# Patient Record
Sex: Male | Born: 1950
Health system: Southern US, Community
[De-identification: ages and names within clinical notes are randomized; demographics above are authoritative.]

## PROBLEM LIST (undated history)

## (undated) DIAGNOSIS — K219 Gastro-esophageal reflux disease without esophagitis: Secondary | ICD-10-CM

## (undated) DIAGNOSIS — K2289 Other specified disease of esophagus: Secondary | ICD-10-CM

## (undated) DIAGNOSIS — C140 Malignant neoplasm of pharynx, unspecified: Secondary | ICD-10-CM

## (undated) DIAGNOSIS — I10 Essential (primary) hypertension: Secondary | ICD-10-CM

## (undated) DIAGNOSIS — Z931 Gastrostomy status: Secondary | ICD-10-CM

## (undated) HISTORY — DX: Gastro-esophageal reflux disease without esophagitis: K21.9

## (undated) HISTORY — DX: Malignant neoplasm of pharynx, unspecified: C14.0

## (undated) HISTORY — DX: Gastrostomy status: Z93.1

## (undated) HISTORY — DX: Essential (primary) hypertension: I10

## (undated) HISTORY — DX: Other specified disease of esophagus: K22.89

---

## 2021-05-10 ENCOUNTER — Inpatient Hospital Stay: Payer: Medicare Managed Care Other

## 2021-05-10 ENCOUNTER — Encounter: Payer: Self-pay | Admitting: Critical Care Medicine

## 2021-05-10 ENCOUNTER — Inpatient Hospital Stay
Admission: AD | Admit: 2021-05-10 | Discharge: 2021-05-14 | DRG: 199 | Disposition: A | Discharge status: Home / Self Care | Payer: Medicare Managed Care Other | Source: Other Acute Inpatient Hospital | Attending: Internal Medicine | Admitting: Internal Medicine

## 2021-05-10 DIAGNOSIS — Z79890 Hormone replacement therapy (postmenopausal): Secondary | ICD-10-CM

## 2021-05-10 DIAGNOSIS — J189 Pneumonia, unspecified organism: Secondary | ICD-10-CM | POA: Diagnosis present

## 2021-05-10 DIAGNOSIS — Z8521 Personal history of malignant neoplasm of larynx: Secondary | ICD-10-CM

## 2021-05-10 DIAGNOSIS — E785 Hyperlipidemia, unspecified: Secondary | ICD-10-CM | POA: Diagnosis present

## 2021-05-10 DIAGNOSIS — Z79899 Other long term (current) drug therapy: Secondary | ICD-10-CM

## 2021-05-10 DIAGNOSIS — Z923 Personal history of irradiation: Secondary | ICD-10-CM

## 2021-05-10 DIAGNOSIS — A419 Sepsis, unspecified organism: Secondary | ICD-10-CM

## 2021-05-10 DIAGNOSIS — I10 Essential (primary) hypertension: Secondary | ICD-10-CM | POA: Diagnosis present

## 2021-05-10 DIAGNOSIS — K219 Gastro-esophageal reflux disease without esophagitis: Secondary | ICD-10-CM | POA: Diagnosis present

## 2021-05-10 DIAGNOSIS — Z792 Long term (current) use of antibiotics: Secondary | ICD-10-CM

## 2021-05-10 DIAGNOSIS — K223 Perforation of esophagus: Secondary | ICD-10-CM | POA: Diagnosis present

## 2021-05-10 DIAGNOSIS — E039 Hypothyroidism, unspecified: Secondary | ICD-10-CM | POA: Diagnosis present

## 2021-05-10 DIAGNOSIS — Z85819 Personal history of malignant neoplasm of unspecified site of lip, oral cavity, and pharynx: Secondary | ICD-10-CM

## 2021-05-10 DIAGNOSIS — K9421 Gastrostomy hemorrhage: Secondary | ICD-10-CM | POA: Diagnosis present

## 2021-05-10 DIAGNOSIS — E162 Hypoglycemia, unspecified: Secondary | ICD-10-CM | POA: Diagnosis not present

## 2021-05-10 DIAGNOSIS — J982 Interstitial emphysema: Principal | ICD-10-CM | POA: Diagnosis present

## 2021-05-10 DIAGNOSIS — Z87891 Personal history of nicotine dependence: Secondary | ICD-10-CM

## 2021-05-10 LAB — URINALYSIS REFLEX TO MICROSCOPIC EXAM - REFLEX TO CULTURE
Bilirubin, UA: NEGATIVE
Blood, UA: NEGATIVE
Glucose, UA: NEGATIVE
Leukocyte Esterase, UA: NEGATIVE
Nitrite, UA: NEGATIVE
Specific Gravity UA: 1.036 — AB (ref 1.001–1.035)
Urine pH: 7.5 (ref 5.0–8.0)
Urobilinogen, UA: NORMAL mg/dL (ref 0.2–2.0)

## 2021-05-10 LAB — PT AND APTT
PT INR: 1.2 — ABNORMAL HIGH (ref 0.9–1.1)
PT: 13.8 s — ABNORMAL HIGH (ref 10.1–12.9)
PTT: 26 s — ABNORMAL LOW (ref 27–39)

## 2021-05-10 LAB — COMPREHENSIVE METABOLIC PANEL
ALT: 50 U/L (ref 0–55)
AST (SGOT): 35 U/L (ref 5–41)
Albumin/Globulin Ratio: 0.9 (ref 0.9–2.2)
Albumin: 3.6 g/dL (ref 3.5–5.0)
Alkaline Phosphatase: 72 U/L (ref 37–117)
Anion Gap: 10 (ref 5.0–15.0)
BUN: 29 mg/dL — ABNORMAL HIGH (ref 9.0–28.0)
Bilirubin, Total: 0.5 mg/dL (ref 0.2–1.2)
CO2: 26 mEq/L (ref 17–29)
Calcium: 9.4 mg/dL (ref 7.9–10.2)
Chloride: 103 mEq/L (ref 99–111)
Creatinine: 0.9 mg/dL (ref 0.5–1.5)
Globulin: 4.2 g/dL — ABNORMAL HIGH (ref 2.0–3.6)
Glucose: 92 mg/dL (ref 70–100)
Potassium: 4.2 mEq/L (ref 3.5–5.3)
Protein, Total: 7.8 g/dL (ref 6.0–8.3)
Sodium: 139 mEq/L (ref 135–145)

## 2021-05-10 LAB — CBC AND DIFFERENTIAL
Absolute NRBC: 0 10*3/uL (ref 0.00–0.00)
Basophils Absolute Automated: 0.04 10*3/uL (ref 0.00–0.08)
Basophils Automated: 0.4 %
Eosinophils Absolute Automated: 0.08 10*3/uL (ref 0.00–0.44)
Eosinophils Automated: 0.9 %
Hematocrit: 37.7 % (ref 37.6–49.6)
Hgb: 12.4 g/dL — ABNORMAL LOW (ref 12.5–17.1)
Immature Granulocytes Absolute: 0.02 10*3/uL (ref 0.00–0.07)
Immature Granulocytes: 0.2 %
Lymphocytes Absolute Automated: 1.71 10*3/uL (ref 0.42–3.22)
Lymphocytes Automated: 18.4 %
MCH: 23.5 pg — ABNORMAL LOW (ref 25.1–33.5)
MCHC: 32.9 g/dL (ref 31.5–35.8)
MCV: 71.4 fL — ABNORMAL LOW (ref 78.0–96.0)
MPV: 11.9 fL (ref 8.9–12.5)
Monocytes Absolute Automated: 0.76 10*3/uL (ref 0.21–0.85)
Monocytes: 8.2 %
Neutrophils Absolute: 6.66 10*3/uL — ABNORMAL HIGH (ref 1.10–6.33)
Neutrophils: 71.9 %
Nucleated RBC: 0 /100 WBC (ref 0.0–0.0)
Platelets: 183 10*3/uL (ref 142–346)
RBC: 5.28 10*6/uL (ref 4.20–5.90)
RDW: 15 % (ref 11–15)
WBC: 9.27 10*3/uL (ref 3.10–9.50)

## 2021-05-10 LAB — TYPE AND SCREEN
AB Screen Gel: NEGATIVE
ABO Rh: O POS

## 2021-05-10 LAB — GFR: EGFR: >60

## 2021-05-10 MED ORDER — SODIUM CHLORIDE 0.9 % IV MBP
4.5000 g | Freq: Three times a day (TID) | INTRAVENOUS | Status: DC
Start: 2021-05-10 — End: 2021-05-14
  Administered 2021-05-10 – 2021-05-14 (×12): 4.5 g via INTRAVENOUS
  Filled 2021-05-10 (×12): qty 20

## 2021-05-10 MED ORDER — PANTOPRAZOLE SODIUM 40 MG IV SOLR
40.0000 mg | Freq: Two times a day (BID) | INTRAVENOUS | Status: DC
Start: 2021-05-10 — End: 2021-05-14
  Administered 2021-05-10 – 2021-05-14 (×8): 40 mg via INTRAVENOUS
  Filled 2021-05-10 (×8): qty 40

## 2021-05-10 MED ORDER — SENNOSIDES-DOCUSATE SODIUM 8.6-50 MG PO TABS
1.0000 | ORAL_TABLET | Freq: Two times a day (BID) | ORAL | Status: DC
Start: 2021-05-10 — End: 2021-05-10

## 2021-05-10 MED ORDER — FLUCONAZOLE IN SODIUM CHLORIDE 400-0.9 MG/200ML-% IV SOLN
400.0000 mg | INTRAVENOUS | Status: DC
Start: 2021-05-10 — End: 2021-05-14
  Administered 2021-05-10 – 2021-05-13 (×4): 400 mg via INTRAVENOUS
  Filled 2021-05-10 (×6): qty 200

## 2021-05-10 MED ORDER — METRONIDAZOLE IN NACL 500 MG/100 ML IV SOLN (WRAP)
500.0000 mg | Freq: Three times a day (TID) | INTRAVENOUS | Status: DC
Start: 2021-05-10 — End: 2021-05-12
  Administered 2021-05-10 – 2021-05-12 (×6): 500 mg via INTRAVENOUS
  Filled 2021-05-10 (×6): qty 100

## 2021-05-10 MED ORDER — LEVOTHYROXINE SODIUM 100 MCG IV SOLR
37.5000 ug | Freq: Every day | INTRAVENOUS | Status: DC
Start: 2021-05-11 — End: 2021-05-12
  Administered 2021-05-11 – 2021-05-12 (×2): 37.5 ug via INTRAVENOUS
  Filled 2021-05-10 (×3): qty 100

## 2021-05-10 NOTE — Consults (Signed)
SURGICAL CONSULTATION  Team 2: Thoracic Surgery, [facility] [phone]    Date Time: 05/10/21 8:53 PM  Patient Name: Charles Peterson  Attending Physician: Velia Meyer, MD  Consulting Physician:  Catha Nottingham, MD   Reason for consultation: TXF from OSH with pneumomediastinum    History of Present Illness:   Charles Peterson is a 70 y.o. male w/ PMH of throat cancer s/p resection by ENT, dysphagia s/p esophageal dilations who p/w blood in his PEG.  The bloody output started on Friday ago. He has never had this before. He presented to a hospital in [place] and they have requested transfer to a hospital with thoracic surgery availability. Denies chest pain, SOB, fever, nausea or vomiting. He has difficulty swallowing and has failed multiple outpatient SLP evaluations. He has been getting esophageal dilations x2. Last was April 04, 2021.     His WBC was 9.3. Other labs notable for a Bun of 29. CT A/P demonstrates air posterior to the mid to lower esophagus without fluid or edema. RLL pneumonia seen. CXR demonstrates RLL haziness.     He had a tracheal resection with stoma creation with a PEG 8 years ago at [facility].     Past Medical History:     Past Medical History:   Diagnosis Date    Esophageal dilatation     Gastroesophageal reflux disease     Hypertension     PEG (percutaneous endoscopic gastrostomy) status     Throat cancer        Past Surgical History:     Past Surgical History:   Procedure Laterality Date    NECK SURGERY         Family History:   History reviewed. No pertinent family history.    Social History:     Social History     Socioeconomic History    Marital status: Unknown     Spouse name: Not on file    Number of children: Not on file    Years of education: Not on file    Highest education level: Not on file   Occupational History    Not on file   Tobacco Use    Smoking status: Former     Types: Cigarettes     Quit date: 05/05/2001     Years since quitting: 20.0    Smokeless tobacco: Never   Vaping  Use    Vaping Use: Never used   Substance and Sexual Activity    Alcohol use: Never    Drug use: Never    Sexual activity: Not Currently   Other Topics Concern    Not on file   Social History Narrative    Not on file     Social Determinants of Health     Financial Resource Strain: Not on file   Food Insecurity: Not on file   Transportation Needs: Not on file   Physical Activity: Not on file   Stress: Not on file   Social Connections: Not on file   Intimate Partner Violence: Not on file   Housing Stability: Not on file       Allergies:   No Known Allergies    Medications:     Prior to Admission medications    Not on File       Review of Systems:   As per HPI. A complete 12 point review of systems was performed and all other systems reviewed were negative.    Physical Exam:     Vitals:  05/10/21 2000   BP: 112/69   Pulse: 70   Resp: 21   Temp: 98.2 F (36.8 C)   SpO2: 98%       Intake and Output Summary (Last 24 hours) at Date Time    Intake/Output Summary (Last 24 hours) at 05/10/2021 2053  Last data filed at 05/10/2021 1905  Gross per 24 hour   Intake 398.33 ml   Output 150 ml   Net 248.33 ml       PHYSICAL EXAM:  General: NAD  HEENT: NC, AT, dry MM, EOMI  Neck: tracheal stoma covered with a patch with mild SS strike through, high pitched voice, dysphonia  Chest:  Normal respiratory effort, no wheezing, no crackles, no diminished breath sounds  Heart:  RRR, no heart sound variability on inspiration, no murmurs  Abdomen:  Soft, nondistended, nontender, PEG in place  Extremities:  MAE, thin limbs, palpable distal pulses bialteraly  Neuro:  Grossly normal, no focal deficits   Skim: warm and dry    Labs:     Recent Labs   Lab 05/10/21  1448   WBC 9.27   Hgb 12.4*   Hematocrit 37.7   MCV 71.4*   Platelets 183   Glucose 92   BUN 29.0*   Creatinine 0.9   Sodium 139   Potassium 4.2   Chloride 103   CO2 26   Calcium 9.4   PT 13.8*   PTT 26*   PT INR 1.2*     Recent Labs   Lab 05/10/21  1448   Bilirubin, Total 0.5    Protein, Total 7.8   Albumin 3.6   ALT 50   AST (SGOT) 35         Rads:   XR Chest AP Portable    Result Date: 05/10/2021  No acute cardiopulmonary process. Clide Cliff, MD  05/10/2021 3:15 PM     OSH CT Chest reviewed with radiology: air posterior to the mid to lower esophagus without fluid or edema evident. Unable to determine timing for the onset of injury. RLL pneumonia seen    Problem List:     Patient Active Problem List   Diagnosis    Pneumomediastinum       Assessment:   70 y.o. male here with pneumomediastinum and bloody PEG output c/f esophageal perforation. Patient is stable and if he continues to do well clinically we may be able to treat this nonoperatively    Plan:   - NAI  - Consult GI for endoscopy to evaluate esophageal perforation and GI bleed   - PPI  - Broad spectrum antibiotic coverage: zosyn, antifungal  - NPO  - Hold TF until imaging studies result  - mIVF  - Daily CXR  - Patient plan d/w Dr. Bobbie Stack    Signed by:  Magdalene Molly, MD  General Surgery Resident  PGY3    Attending Summary:    Attestation:  I saw the patient with our surgical provider team.  We examined the patient together and I have reviewed and verified The team's overall assessment, HPI, as well as their interpretation of relevant imaging studies, laboratory values, and her findings and plan.      Physical Exam:    General: Alert and Oriented X 3, No acute Distress  Neuro-Intact  HEENT: normocephalic,atraumatic  Skin: Dry    Summary and Plan:    This is a 70 year old male with prior head neck surgery.  He was found to have pneumomediastinum.  By assessment  this is likely due to coughing and has no need for a endoscopic or swallow study.    I spent 60 minutes of time in medical decision making with this patient's case which included evaluating imaging studies, referring physician notes, and face to face discussion with the patient.        [name], MD  Director, Thoracic Surgery [facility]

## 2021-05-10 NOTE — Plan of Care (Signed)
4 eyes in 4 hours pressure injury assessment note:      Completed with: RN Monica  Unit & Time admitted: MCCSU, NT4, completed with RN Monica             Bony Prominences: Check appropriate box; if wound is present enter wound assessment in LDA     Occiput:                 [x] WNL  []  Wound present  Face:                     [x] WNL  []  Wound present  Ears:                      [x] WNL  []  Wound present  Spine:                    [x] WNL  []  Wound present  Shoulders:             [x] WNL  []  Wound present  Elbows:                  [x] WNL  []  Wound present  Sacrum/coccyx:     [x] WNL  []  Wound present  Ischial Tuberosity:  [x] WNL  []  Wound present  Trochanter/Hip:      [x] WNL  []  Wound present  Knees:                   [x] WNL  []  Wound present  Ankles:                   [x] WNL  []  Wound present  Heels:                    [x] WNL  []  Wound present  Other pressure areas:  []  Wound location       Device related: []  Device name:         LDA completed if wound present: N/A  Consult WOCN if necessary    Other skin related issues, ie tears, rash, etc, document in Integumentary flowsheet  Cracking of heels, no open skin issues

## 2021-05-10 NOTE — H&P (Signed)
[facility]- Medical Critical Care Service [facility])      ADMISSION- HISTORY AND PHYSICAL EXAM      Date Time: 05/10/21 2:06 PM  Patient Name: Charles Peterson  Attending Physician: Lianne Moris, MD  Primary Care Physician: No primary care provider on file.  Location/Room: Z610/R604.54     Chief Complaint / Primary reason for ICU evaluation:  Pneumomediastinum      History of Presenting Illness:   Charles Peterson is a 70 y.o. 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  He had esophageal balloon dilation twice with the last time in Sept 2022.  He presented to [facility] ER on 05/09/21 after coughing and noting blood from his PEG tube along with mid sternal tightness.  He denied shortness of breath, fatigue, other GI, GU, or neurological complaints.   CT scan of the chest did show pneumomediastinum in the lower chest concerning for esophageal perforation.  Transfer to [facility] was requested for thoracic surgery evaluation.      Problem List:   Problem List:   Patient Active Problem List   Diagnosis    Pneumomediastinum     Pneumomediastinum  HTN  HLD  Hypothyroidism  GERD  H/o Throat cancer  S/p Stoma creation  S/p PEG tube  S/p esophageal dilation     Plan:   Dispo:    Critical Care services by organ system  NEURO:   NAI    CV:   # HTN  - hold home antihypertensives for now  - Home agents include:  Norvasc 10 mg qd, Hydralazine 50 mg bid, atorvastatin 20 mg qHS  - check 12 lead  - TNI negative at OSH  - BNP 170 at OSH  Blood pressure goal: MAP >65, SBP <160    PULM:   # h/o throat cancer with stoma  - stable on RA    GI:   # Possible esophageal perforation with pneumomediastinum  # bleeding from PEG  # h/o throat cancer  # s/p stoma and PEG  - Thoracic surgery consulted  - ABX as below  - Protonix bid  - NPO given ? Esophageal perforation     RENAL:   NAI  I/O Goal:  AR    ID:   # possible esophageal perforation  - Check blood cultures  - Zosyn  - Metronidazole  - Fluconazole    HEME:   Check CBC, coags, send type/screen  SCD's  Pharmacologic ppx: not safe secondary to bleeding from PEG    ENDO/RHEUM:   # Hypothyroidism  - Synthroid 75 mcg qam (will order IV 37.5 mcg given possible esophageal perf)  Glucose: goal 100 -180    Code Status: Full Code  Patient is currently able to make medical decisions.      Patients designated proxy is daughter [name] Plan [phone]) and was not present.    Discussed current diagnoses, prognosis and goals of care.     Past Medical History:   No past medical history on file.    Past Surgical History:   No past surgical history on file.    Family History:   No family history on file.    Social History:     Social History     Socioeconomic History    Marital status: Unknown     Spouse name: Not on file    Number of children: Not on file  Years of education: Not on file    Highest education level: Not on file   Occupational History    Not on file   Tobacco Use    Smoking status: Not on file    Smokeless tobacco: Not on file   Substance and Sexual Activity    Alcohol use: Not on file    Drug use: Not on file    Sexual activity: Not on file   Other Topics Concern    Not on file   Social History Narrative    Not on file     Social Determinants of Health     Financial Resource Strain: Not on file   Food Insecurity: Not on file   Transportation Needs: Not on file   Physical Activity: Not on file   Stress: Not on file   Social Connections: Not on file   Intimate Partner Violence: Not on file   Housing Stability: Not on file       Allergies:   No Known Allergies    Medications:   Hospital medications:    Current Facility-Administered Medications   Medication Dose Route Frequency    pantoprazole  40 mg Intravenous BID    senna-docusate  1 tablet Oral Q12H SCH        Current Facility-Administered Medications   Medication       Review of Systems:   As  noted in HPI    Physical Exam:   Heart Rate:  [79] 79  Resp Rate:  [19] 19  BP: (122)/(75) 122/75    Intake and Output Summary (Last 24 hours) at Date Time  No intake or output data in the 24 hours ending 05/10/21 1406    Vent Settings:       BP 122/75    Pulse 79    Resp 19    SpO2 98%      General Appearance:  alert, well appearing, and in no distress  Mental status:  alert, oriented to person, place, and time  Neuro:  alert, oriented, normal speech, no focal findings or movement disorder noted  Lungs: clear to auscultation, no wheezes, rales or rhonchi, symmetric air entry.  Stoma present  Cardiac: normal rate, regular rhythm, normal S1, S2, no murmurs, rubs, clicks or gallops  Abdomen:  soft, nontender, nondistended, no masses or organomegaly.  PEG present, clean site, dark brown liquid noted in tube.  Extremities: peripheral pulses normal, no pedal edema, no clubbing or cyanosis   Skin:   normal coloration and turgor, no rashes, no suspicious skin lesions noted  Other:      Labs:     Results       ** No results found for the last 24 hours. **            Microbiology Results (last 15 days)       Procedure Component Value Units Date/Time    Culture Blood Aerobic and Anaerobic [[id number]]     Order Status: Sent Specimen: Blood, Venipuncture     Culture Blood Aerobic and Anaerobic [[id number]]     Order Status: Sent Specimen: Blood, Venipuncture     MRSA culture [[id number]]     Order Status: Sent Specimen: Culturette from Nares     MRSA culture [[id number]]     Order Status: Sent Specimen: Culturette from Throat             Radiology / Imaging:     No orders to display  I have personally reviewed the patient's history and 24 hour interval events, along with vitals, labs, radiology images, and additional findings found in detail within ICU team notes from house staff, NPPs and nursing, with their care plans developed and reviewed by me. So far today, I have spent 45 minutes providing critical care for this  patient excluding teaching and billable procedures, not overlapping with over providers.     Signed by: Marquita Palms, NP  05/10/2021 2:06 PM  Cc:No primary care provider on file.  For admissions 7a-7p: [phone]  [facility] attending: [phone]  [facility] attending: [phone]    This note was generated by the Epic EMR system/ Dragon speech recognition and may contain inherent errors or omissions not intended by the user. Grammatical errors, random word insertions, deletions, pronoun errors and incomplete sentences are occasional consequences of this technology due to software limitations. Not all errors are caught or corrected. If there are questions or concerns about the content of this note or information contained within the body of this dictation they should be addressed directly with the author for clarification

## 2021-05-11 DIAGNOSIS — E785 Hyperlipidemia, unspecified: Secondary | ICD-10-CM

## 2021-05-11 DIAGNOSIS — Z8521 Personal history of malignant neoplasm of larynx: Secondary | ICD-10-CM

## 2021-05-11 DIAGNOSIS — Z85818 Personal history of malignant neoplasm of other sites of lip, oral cavity, and pharynx: Secondary | ICD-10-CM

## 2021-05-11 DIAGNOSIS — K922 Gastrointestinal hemorrhage, unspecified: Secondary | ICD-10-CM

## 2021-05-11 DIAGNOSIS — Z930 Tracheostomy status: Secondary | ICD-10-CM

## 2021-05-11 DIAGNOSIS — K222 Esophageal obstruction: Secondary | ICD-10-CM

## 2021-05-11 DIAGNOSIS — I10 Essential (primary) hypertension: Secondary | ICD-10-CM

## 2021-05-11 LAB — CBC
Absolute NRBC: 0 10*3/uL (ref 0.00–0.00)
Hematocrit: 32.3 % — ABNORMAL LOW (ref 37.6–49.6)
Hgb: 10.5 g/dL — ABNORMAL LOW (ref 12.5–17.1)
MCH: 23.6 pg — ABNORMAL LOW (ref 25.1–33.5)
MCHC: 32.5 g/dL (ref 31.5–35.8)
MCV: 72.7 fL — ABNORMAL LOW (ref 78.0–96.0)
MPV: 11.8 fL (ref 8.9–12.5)
Nucleated RBC: 0 /100 WBC (ref 0.0–0.0)
Platelets: 166 10*3/uL (ref 142–346)
RBC: 4.44 10*6/uL (ref 4.20–5.90)
RDW: 15 % (ref 11–15)
WBC: 7.94 10*3/uL (ref 3.10–9.50)

## 2021-05-11 LAB — BASIC METABOLIC PANEL
Anion Gap: 8 (ref 5.0–15.0)
BUN: 23 mg/dL (ref 9.0–28.0)
CO2: 24 mEq/L (ref 17–29)
Calcium: 8.6 mg/dL (ref 7.9–10.2)
Chloride: 106 mEq/L (ref 99–111)
Creatinine: 0.9 mg/dL (ref 0.5–1.5)
Glucose: 82 mg/dL (ref 70–100)
Potassium: 3.9 mEq/L (ref 3.5–5.3)
Sodium: 138 mEq/L (ref 135–145)

## 2021-05-11 LAB — MRSA CULTURE
Culture MRSA Surveillance: NEGATIVE
Culture MRSA Surveillance: NEGATIVE

## 2021-05-11 LAB — GFR: EGFR: >60

## 2021-05-11 NOTE — Progress Notes (Signed)
[facility]- Medical Critical Care Service [facility])      Progress Note      Date Time: 05/11/21 11:24 AM  Patient Name: Charles Peterson  Attending Physician: Velia Meyer, MD  Primary Care Physician: No primary care provider on file.  Location/Room: A416/S063.01     Chief Complaint / Primary reason for ICU evaluation:  Pneumomediastinum      History of Presenting Illness:   Charles Peterson is a 70 y.o. 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  He had esophageal balloon dilation twice with the last time in Sept 2022.  He presented to [facility] ER on 05/09/21 after coughing and noting blood from his PEG tube along with mid sternal tightness.  He denied shortness of breath, fatigue, other GI, GU, or neurological complaints.   CT scan of the chest did show pneumomediastinum in the lower chest concerning for esophageal perforation.  Transfer to [facility] was requested for thoracic surgery evaluation.      Problem List:   Problem List:   Patient Active Problem List   Diagnosis    Pneumomediastinum     Pneumomediastinum  HTN  HLD  Hypothyroidism  GERD  H/o Throat cancer  S/p Stoma creation  S/p PEG tube  S/p esophageal dilation     Subjective: No events overnight    Plan:   Dispo:    Critical Care services by organ system  NEURO:   NAI    CV:   # HTN  - hold home antihypertensives for now  - Home agents include:  Norvasc 10 mg qd, Hydralazine 50 mg bid, atorvastatin 20 mg qHS  - check 12 lead  - TNI negative at OSH  - BNP 170 at OSH  Blood pressure goal: MAP >65, SBP <160    PULM:   # h/o throat cancer with stoma  - stable on RA    GI:   # Possible esophageal perforation with pneumomediastinum  # bleeding from PEG  # h/o throat cancer  # s/p stoma and PEG  - Evaluated by thoracic surgery - no intervention needed. Recommend GI  -GI consulted to eval for GIB/EGD for perf    RENAL:    NAI  I/O Goal: AR    ID:   # possible esophageal perforation  - Low suspicion for frank esophageal perforation - can continue abx for now pending GI evaluation  - Zosyn  - Metronidazole  - Fluconazole    HEME:   Check CBC, coags, send type/screen  SCD's  Pharmacologic ppx: not safe secondary to bleeding from PEG    ENDO/RHEUM:   # Hypothyroidism  - Synthroid 75 mcg qam (will order IV 37.5 mcg given possible esophageal perf)  Glucose: goal 100 -180    Code Status: Full Code  Patient is currently able to make medical decisions.      Patients designated proxy is daughter [name] Plan [phone]) and was not present.    Discussed current diagnoses, prognosis and goals of care.     Past Medical History:     Past Medical History:   Diagnosis Date    Esophageal dilatation     Gastroesophageal reflux disease     Hypertension     PEG (percutaneous endoscopic gastrostomy) status     Throat cancer        Past Surgical History:     Past Surgical History:  Procedure Laterality Date    NECK SURGERY         Family History:   History reviewed. No pertinent family history.    Social History:     Social History     Socioeconomic History    Marital status: Unknown     Spouse name: Not on file    Number of children: Not on file    Years of education: Not on file    Highest education level: Not on file   Occupational History    Not on file   Tobacco Use    Smoking status: Former     Types: Cigarettes     Quit date: 05/05/2001     Years since quitting: 20.0    Smokeless tobacco: Never   Vaping Use    Vaping Use: Never used   Substance and Sexual Activity    Alcohol use: Never    Drug use: Never    Sexual activity: Not Currently   Other Topics Concern    Not on file   Social History Narrative    Not on file     Social Determinants of Health     Financial Resource Strain: Not on file   Food Insecurity: Not on file   Transportation Needs: Not on file   Physical Activity: Not on file   Stress: Not on file   Social Connections: Not on  file   Intimate Partner Violence: Not on file   Housing Stability: Not on file       Allergies:   No Known Allergies    Medications:   Hospital medications:    Current Facility-Administered Medications   Medication Dose Route Frequency    fluconazole  400 mg Intravenous Q24H    levothyroxine  37.5 mcg Intravenous Daily at 0600    metroNIDAZOLE  500 mg Intravenous Q8H SCH    pantoprazole  40 mg Intravenous BID    piperacillin-tazobactam  4.5 g Intravenous Q8H        Current Facility-Administered Medications   Medication       Review of Systems:   As noted in HPI    Physical Exam:   Temp:  [97.6 F (36.4 C)-98.4 F (36.9 C)] 98.4 F (36.9 C)  Heart Rate:  [59-82] 65  Resp Rate:  [13-25] 17  BP: (89-127)/(56-80) 97/60    Intake and Output Summary (Last 24 hours) at Date Time    Intake/Output Summary (Last 24 hours) at 05/11/2021 1124  Last data filed at 05/11/2021 0830  Gross per 24 hour   Intake 708.33 ml   Output 400 ml   Net 308.33 ml       Vent Settings:       BP 97/60    Pulse 65    Temp 98.4 F (36.9 C) (Oral)    Resp 17    Ht 1.727 m (5\' 8" )    Wt 59.9 kg (132 lb) Comment: stated   SpO2 96%    BMI 20.07 kg/m      General Appearance:  alert, well appearing, and in no distress  Mental status:  alert, oriented to person, place, and time  Neuro:  alert, oriented, normal speech, no focal findings or movement disorder noted  Lungs: clear to auscultation, no wheezes, rales or rhonchi, symmetric air entry.  Stoma present  Cardiac: normal rate, regular rhythm, normal S1, S2, no murmurs, rubs, clicks or gallops  Abdomen:  soft, nontender, nondistended, no masses or organomegaly.  PEG present,  clean site, dark brown liquid noted in tube.  Extremities: peripheral pulses normal, no pedal edema, no clubbing or cyanosis   Skin:   normal coloration and turgor, no rashes, no suspicious skin lesions noted  Other:      Labs:     Results       Procedure Component Value Units Date/Time    Basic Metabolic Panel [[id number]]  Collected: 05/11/21 0123    Specimen: Blood Updated: 05/11/21 0206     Glucose 82 mg/dL      BUN 40.9 mg/dL      Creatinine 0.9 mg/dL      Calcium 8.6 mg/dL      Sodium 811 mEq/L      Potassium 3.9 mEq/L      Chloride 106 mEq/L      CO2 24 mEq/L      Anion Gap 8.0    GFR [[id number]] Collected: 05/11/21 0123     Updated: 05/11/21 0206     EGFR >60.0       CBC without differential [[id number]]  (Abnormal) Collected: 05/11/21 0123    Specimen: Blood Updated: 05/11/21 0150     WBC 7.94 x10 3/uL      Hgb 10.5 g/dL      Hematocrit 46.9 %      Platelets 166 x10 3/uL      RBC 4.44 x10 6/uL      MCV 72.7 fL      MCH 23.6 pg      MCHC 32.5 g/dL      RDW 15 %      MPV 11.8 fL      Nucleated RBC 0.0 /100 WBC      Absolute NRBC 0.00 x10 3/uL     MRSA culture [[id number]] Collected: 05/10/21 1426    Specimen: Culturette from Nasal Swab Updated: 05/10/21 1746    MRSA culture [[id number]] Collected: 05/10/21 1426    Specimen: Culturette from Throat Updated: 05/10/21 1746    Culture Blood Aerobic and Anaerobic [[id number]] Collected: 05/10/21 1442    Specimen: Blood, Venipuncture Updated: 05/10/21 1619    Narrative:      The order will result in two separate 8-4ml bottles  Please do NOT order repeat blood cultures if one has been  drawn within the last 48 hours  UNLESS concerned for  endocarditis  AVOID BLOOD CULTURE DRAWS FROM CENTRAL LINE IF POSSIBLE  Indications:->Sepsis  Res[place]uled by [id number] at 05/10/2021 14:50 Reason: Printed by   mistake/Printing Issues.  1 BLUE+1 PURPLE    Culture Blood Aerobic and Anaerobic [[id number]] Collected: 05/10/21 1442    Specimen: Blood, Venipuncture Updated: 05/10/21 1619    Narrative:      The order will result in two separate 8-30ml bottles  Please do NOT order repeat blood cultures if one has been  drawn within the last 48 hours  UNLESS concerned for  endocarditis  AVOID BLOOD CULTURE DRAWS FROM CENTRAL LINE IF POSSIBLE  Indications:->Sepsis  1 BLUE+1 PURPLE    Type and Screen [[id number]]  Collected: 05/10/21 1448    Specimen: Blood Updated: 05/10/21 1558     ABO Rh O POS     AB Screen Gel NEG    Comprehensive metabolic panel [[id number]]  (Abnormal) Collected: 05/10/21 1448    Specimen: Blood Updated: 05/10/21 1530     Glucose 92 mg/dL      BUN 10.9 mg/dL      Creatinine 0.9 mg/dL      Sodium 323 mEq/L  Potassium 4.2 mEq/L      Chloride 103 mEq/L      CO2 26 mEq/L      Calcium 9.4 mg/dL      Protein, Total 7.8 g/dL      Albumin 3.6 g/dL      AST (SGOT) 35 U/L      ALT 50 U/L      Alkaline Phosphatase 72 U/L      Bilirubin, Total 0.5 mg/dL      Globulin 4.2 g/dL      Albumin/Globulin Ratio 0.9     Anion Gap 10.0    GFR [[id number]] Collected: 05/10/21 1448     Updated: 05/10/21 1530     EGFR >60.0       PT/APTT [[id number]]  (Abnormal) Collected: 05/10/21 1448     Updated: 05/10/21 1520     PT 13.8 sec      PT INR 1.2     PTT 26 sec     CBC and differential [[id number]]  (Abnormal) Collected: 05/10/21 1448    Specimen: Blood Updated: 05/10/21 1517     WBC 9.27 x10 3/uL      Hgb 12.4 g/dL      Hematocrit 08.6 %      Platelets 183 x10 3/uL      RBC 5.28 x10 6/uL      MCV 71.4 fL      MCH 23.5 pg      MCHC 32.9 g/dL      RDW 15 %      MPV 11.9 fL      Neutrophils 71.9 %      Lymphocytes Automated 18.4 %      Monocytes 8.2 %      Eosinophils Automated 0.9 %      Basophils Automated 0.4 %      Immature Granulocytes 0.2 %      Nucleated RBC 0.0 /100 WBC      Neutrophils Absolute 6.66 x10 3/uL      Lymphocytes Absolute Automated 1.71 x10 3/uL      Monocytes Absolute Automated 0.76 x10 3/uL      Eosinophils Absolute Automated 0.08 x10 3/uL      Basophils Absolute Automated 0.04 x10 3/uL      Immature Granulocytes Absolute 0.02 x10 3/uL      Absolute NRBC 0.00 x10 3/uL     Urinalysis Reflex to Microscopic Exam- Reflex to Culture [[id number]]  (Abnormal) Collected: 05/10/21 1426    Specimen: Urine, Clean Catch Updated: 05/10/21 1507     Urine Type Urine, Clean Ca     Color, UA Straw     Clarity, UA Clear      Specific Gravity UA 1.036     Urine pH 7.5     Leukocyte Esterase, UA Negative     Nitrite, UA Negative     Protein, UR 30= 1+     Glucose, UA Negative     Ketones UA 10= 1+     Urobilinogen, UA Normal mg/dL      Bilirubin, UA Negative     Blood, UA Negative     RBC, UA 6-10 /hpf      WBC, UA 0-5 /hpf      Squamous Epithelial Cells, Urine 0-5 /hpf             Microbiology Results (last 15 days)       Procedure Component Value Units Date/Time    Culture Blood Aerobic and Anaerobic [[id number]] Collected: 05/10/21  1442    Order Status: Sent Specimen: Blood, Venipuncture Updated: 05/10/21 1619    Narrative:      The order will result in two separate 8-75ml bottles  Please do NOT order repeat blood cultures if one has been  drawn within the last 48 hours  UNLESS concerned for  endocarditis  AVOID BLOOD CULTURE DRAWS FROM CENTRAL LINE IF POSSIBLE  Indications:->Sepsis  1 BLUE+1 PURPLE    Culture Blood Aerobic and Anaerobic [[id number]] Collected: 05/10/21 1442    Order Status: Sent Specimen: Blood, Venipuncture Updated: 05/10/21 1619    Narrative:      The order will result in two separate 8-38ml bottles  Please do NOT order repeat blood cultures if one has been  drawn within the last 48 hours  UNLESS concerned for  endocarditis  AVOID BLOOD CULTURE DRAWS FROM CENTRAL LINE IF POSSIBLE  Indications:->Sepsis  Res[place]uled by [id number] at 05/10/2021 14:50 Reason: Printed by   mistake/Printing Issues.  1 BLUE+1 PURPLE    MRSA culture [[id number]] Collected: 05/10/21 1426    Order Status: Sent Specimen: Culturette from Nasal Swab Updated: 05/10/21 1746    MRSA culture [[id number]] Collected: 05/10/21 1426    Order Status: Sent Specimen: Culturette from Throat Updated: 05/10/21 1746            Radiology / Imaging:     XR Chest AP Portable   Final Result      No acute cardiopulmonary process.      Clide Cliff, MD    05/10/2021 3:15 PM      CT Body  Imported Outside Images   Final Result      CT Body  Imported Outside Images   Final  Result          I personally saw and evaluated the patient and directly participated in the management. I have spent >36 mins in patient care today while reviewing records, coordinating patient care, examining/counseling the patient/family; over half of this time was spent in counseling/coordination of care.    Velia Meyer, MD  [facility] Medical Critical Care Service     Signed by: Velia Meyer, MD  05/11/2021 11:24 AM  Cc:No primary care provider on file.  For admissions 7a-7p: [phone]  [facility] attending: [phone]  [facility] attending: [phone]

## 2021-05-11 NOTE — Consults (Signed)
CONSULT NOTE  Gastroenterology Consult Service - [facility]  [facility] (Group): [facility]   [address], [address] [place], [place] [address]  Appointments: [phone]       Date Time: 05/11/21 10:16 AM  Patient Name: Charles Peterson  Requesting Physician: Velia Meyer, MD       Reason for Consultation:   GI Bleed    Assessment and Recommendations:   GI bleed  - blood via PEG as of 10/21 with coughing, no further bleeding or melena   - Hgb 10.5 10/23 > 12.1 as of 10/22. Baseline Hgb 13.1 12/2020    Esophageal stricture  - hx supraglottic squamous cell carcinoma s/p radiation  - Last EGD 03/2021- severe stricture (0mm- 16mm)  - CT Abdomen/Pelvis 10/22 with nonspecific pneumomediastinum in the lower chest, liquid stool in colon  - CT Chest 10/22 with linear pneumomediastinum, seen in from their proximal/mid periesophageal       Plan:  Continue supportive care with antibiotics and pain management.   Continue PPI and monitor for GI bleeding  Can resume PEG feedings- no oral intake  No current recommendations for GI intervention due to high risk complication with history of severe stenotic stricture, recent dilation, and abnormal CT findings.       Discussed with Dr. Allena Napoleon and developed plan of care  Please call if clinical status changes and urgent need for GI      History:   Charles Peterson is a 70 y.o. male who was transferred from outside to the hospital on 05/10/2021 with red blood via PEG tube that began on Friday. 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 He does not have an oral intake with history of esophageal stricture at the cervical esophagus with dilations 01/29/21 (5mm- 18mm) and 04/04/21 (0-24mm). Abnormal CT with concern for pneumomediastinum (per OSH records- radiologist determined not related to previous dilation and most likely occurred with coughing). PEG tube exchanged x2 months  ago and does have old drainage around the tube site which patient confirms that it does drain around tube. Denies NSAIDs.         Past Medical History:     Past Medical History:   Diagnosis Date    Esophageal dilatation     Gastroesophageal reflux disease     Hypertension     PEG (percutaneous endoscopic gastrostomy) status     Throat cancer        Past Surgical History:     Past Surgical History:   Procedure Laterality Date    NECK SURGERY         Family History:   History reviewed. No pertinent family history.    Social History:     Social History     Socioeconomic History    Marital status: Unknown     Spouse name: Not on file    Number of children: Not on file    Years of education: Not on file    Highest education level: Not on file   Occupational History    Not on file   Tobacco Use    Smoking status: Former     Types: Cigarettes     Quit date: 05/05/2001     Years since quitting: 20.0    Smokeless tobacco: Never   Vaping Use    Vaping Use: Never used   Substance and Sexual Activity    Alcohol use: Never  Drug use: Never    Sexual activity: Not Currently   Other Topics Concern    Not on file   Social History Narrative    Not on file     Social Determinants of Health     Financial Resource Strain: Not on file   Food Insecurity: Not on file   Transportation Needs: Not on file   Physical Activity: Not on file   Stress: Not on file   Social Connections: Not on file   Intimate Partner Violence: Not on file   Housing Stability: Not on file       Allergies:   No Known Allergies    Medications:     Current Facility-Administered Medications   Medication Dose Route Frequency    fluconazole  400 mg Intravenous Q24H    levothyroxine  37.5 mcg Intravenous Daily at 0600    metroNIDAZOLE  500 mg Intravenous Q8H SCH    pantoprazole  40 mg Intravenous BID    piperacillin-tazobactam  4.5 g Intravenous Q8H       has a current medication list which includes the following prescription(s): amlodipine-atorvastatin - Take 1 tablet  by mouth daily, hydralazine - Take 50 mg by mouth 2 (two) times daily, and levothyroxine - Take 88 mcg by mouth Once a day at 6:00am, and the following Facility-Administered Medications: fluconazole, levothyroxine, metronidazole, pantoprazole, and piperacillin-tazobactam (ZOSYN) 4.5 g in sodium chloride 0.9 % 100 mL IVPB mini-bag plus.     Review of Systems:     Gastrointestinal: See HPI.  Review of Systems   All other systems reviewed and are negative.             Physical Exam:   Physical Exam  Vitals reviewed.   HENT:      Head: Normocephalic.   Neck:      Comments: Stoma with dressings  Cardiovascular:      Rate and Rhythm: Normal rate and regular rhythm.      Heart sounds: Normal heart sounds.   Pulmonary:      Effort: Pulmonary effort is normal.   Abdominal:      General: Bowel sounds are normal.      Palpations: Abdomen is soft.      Tenderness: There is no abdominal tenderness.      Comments: PEG to RUQ with obvious old drainage surrounding tube site   Skin:     General: Skin is warm and dry.   Neurological:      Mental Status: He is alert and oriented to person, place, and time.      Comments: Limited speech with occlusion of stoma          Vitals:    05/11/21 0800   BP: 97/60   Pulse: 65   Resp: 17   Temp: 98.4 F (36.9 C)   SpO2: 96%            Labs Reviewed:     Recent Labs   Lab 05/11/21  0123 05/10/21  1448   WBC 7.94 9.27   Hgb 10.5* 12.4*   Hematocrit 32.3* 37.7   Platelets 166 183       Recent Labs   Lab 05/11/21  0123 05/10/21  1448   Sodium 138 139   Potassium 3.9 4.2   Chloride 106 103   CO2 24 26   BUN 23.0 29.0*   Creatinine 0.9 0.9   Calcium 8.6 9.4   Albumin  --  3.6   Protein, Total  --  7.8   Bilirubin, Total  --  0.5   Alkaline Phosphatase  --  72   ALT  --  50   AST (SGOT)  --  35   Glucose 82 92               Rads:   GI Radiological Procedures since admission  reviewed.   Radiology Results (24 Hour)       Procedure Component Value Units Date/Time    XR Chest AP Portable [[id number]]  Collected: 05/10/21 1515    Order Status: Completed Updated: 05/10/21 1517    Narrative:      HISTORY: Baseline/chest pain     COMPARISON: None    FINDINGS:   The lungs are clear with normal vascularity. There is no pleural  effusion or pneumothorax. Heart is normal size. No hilar or mediastinal  prominence.  No acute osseous changes evident.      Impression:        No acute cardiopulmonary process.    Clide Cliff, MD   05/10/2021 3:15 PM    CT Body  Imported Outside Images [[id number]] Resulted: 05/10/21 1508    Order Status: Completed Updated: 05/10/21 1508    Narrative:      The attached image(s) (individually and collectively, "Image") is/are from   prior  encounter(s) occurring outside the current encounter at [facility].  The   Image was obtained from the patient or patient's treating physician (in   some cases, at the request of the patient's referring physician), and is   incorporated into the [facility] medical record system for reference for the   patient's current encounter and future encounters at [place].  No current   interpretation is provided for the Image.      CT Body  Imported Outside Images [[id number]] Resulted: 05/10/21 1457    Order Status: Completed Updated: 05/10/21 1457    Narrative:      The attached image(s) (individually and collectively, "Image") is/are from   prior  encounter(s) occurring outside the current encounter at [facility].  The   Image was obtained from the patient or patient's treating physician (in   some cases, at the request of the patient's referring physician), and is   incorporated into the [facility] medical record system for reference for the   patient's current encounter and future encounters at [place].  No current   interpretation is provided for the Image.                  Signed by: Naida Sleight, FNP- C  [employer] pager ID: [phone]

## 2021-05-11 NOTE — Progress Notes (Addendum)
Thoracic Surgery Daily Progress Note  Team Spectra: [phone]      Hospital Day: Hospital Day: 2  Post Operative Day: * No surgery found *  Primary Procedure: None    HPI: Charles Peterson is a 70 y.o. male w/ PMH of throat cancer s/p resection by ENT, dysphagia s/p esophageal dilations who p/w blood in his PEG.  The bloody output started on Friday, 3 days ago. He has never had this before. He presented to a hospital in [place] and they have requested transfer to a hospital with thoracic surgery availability. Denies chest pain, SOB, fever, nausea or vomiting. He has difficulty swallowing and has failed multiple outpatient SLP evaluations. He has undergone esophageal dilations x2. Last was April 04, 2021. He says after the first dilation he saw blood in his PEG but did not see blood after this most recent dilation.     CT A/P demonstrates air posterior to the mid to lower esophagus without fluid or edema. RLL pneumonia seen. CXR demonstrates RLL haziness.      He had a tracheal resection with stoma creation with a PEG 8 years ago at [facility].     Interval History:   NAEON. Tried to clarify patient's surgical history this morning. He clearly has had a tracheal resection with stoma creation but also says his esophagus was resected as well. It's more likely that the radiation has caused a stricture and that is the pathology for the dilations. He says all of his care has been at [facility].     This morning in exam, his abdomen is soft, non-tender and PEG has no noticeable blood in the tubing. He denies melena or hematochezia. He denies any chest pain, SOB. He has remained HDS since admission.     Physical Exam:   Current Vitals:   BP (!) 89/62    Pulse 64    Temp 97.6 F (36.4 C) (Axillary)    Resp 16    Ht 1.727 m (5\' 8" )    Wt 59.9 kg (132 lb) Comment: stated   SpO2 98%    BMI 20.07 kg/m       GEN:  A&O x3, NAD  HEENT: tracheal stoma covered with patch, voice weak and hoarse   COR: RRR, no m/r/g  LUNGS: B/L CTA  anteriorly, good respiratory effort   ABD: soft, nttp, non-distended, PEG in place with no blood noted in tubing  EXTREMITIES: warm, no edema    Laboratory and Radiological Results:     Lab Results   Component Value Date    WBC 7.94 05/11/2021    HGB 10.5 (L) 05/11/2021    HCT 32.3 (L) 05/11/2021    MCV 72.7 (L) 05/11/2021    PLT 166 05/11/2021     Lab Results   Component Value Date    NA 138 05/11/2021    K 3.9 05/11/2021    CL 106 05/11/2021    CO2 24 05/11/2021    BUN 23.0 05/11/2021    CREAT 0.9 05/11/2021         XR Chest AP Portable    Result Date: 05/10/2021  No acute cardiopulmonary process. Clide Cliff, MD  05/10/2021 3:15 PM         Assessment:   Charles Peterson is a 70 y.o. male with history of tracheal resection and stoma creation and unclear esophageal surgical history with dysphagia requiring esophageal dilations and PEG presenting with blood per PEG and pneumomediastinum.      Plan:   -  GI consult and upper endoscopy to evaluate for source of UGI bleed  - Continue NPO, PPI BID and antibiotics  - As long as patient continues to remain hemodynamically stable, he does not need a CT esophagram with PO contrast  - Nothing to do from a thoracic surgery standpoint.      Signed by: Charles Furry, MD  Thoracic Surgery

## 2021-05-11 NOTE — UM Notes (Signed)
10/22-10/23 npn.__. IMC. DX: Pneumomediastinum and bloody PEG output c/f esophageal perforation.  Transferred from OSH to our facility since we have thoracic surgery capability, for bloody output in his PEG since Friday, hx of throat cancer s/p resection by ENT, dysphagia s/p esophageal dilations, difficulty swallowing and multiple outpatient SLP evaluations. All his meds are IV,  difficulty swallowing IV Fluconazole, IV Metronidazole, IV Piptazo, IV PPI, IV Levothyroxine    MCG:   MET  -  FOR INPATIENT ADMISSION:  Criteria Set Name - Subset   Gastroenterology GRG - Clinical Indications for Admission to Inpatient Care  (X) Hospital admission is needed for appropriate care of the patient because of  1 or more  of    the following :       (X) Suspected acute intra-abdominal process, as indicated by  1 or more  of the following  (1)       (2) (3) (4) (5):          (X) Other signs or symptoms of acute abdominal disease (eg, severe pain, free air) (12)            05/10/21 1330  ADMIT TO INPATIENT  Once        Diagnosis: Pneumomediastinum    Level of Care: Intermediate Care    Patient Class: Inpatient            Number of reviews in this clinical: 2 Days. Admit to inpt 10/22, CSR 10/23.  LOC:  [facility]   [facility] [address]  Payor: /   Name and DOB: Charles Peterson,Charles Peterson [birth year]  DX:   The encounter diagnosis was Pneumomediastinum.  PMH:  has a past medical history of Esophageal dilatation, Gastroesophageal reflux disease, Hypertension, PEG (percutaneous endoscopic gastrostomy) status, and Throat cancer.  PSH:   has a past surgical history that includes Neck surgery.          Enc Vitals Group      BP 05/10/21 1300 122/75      Heart Rate 05/10/21 1300 79      Resp Rate 05/10/21 1300 19      Temp 05/10/21 1400 97.9 F (36.6 C)      Temp Source 05/10/21 2000 Oral      SpO2 05/10/21 1300 98 %      Weight 05/10/21 2000 59.9 kg (132 lb)      Height 05/10/21 2000 1.727 m (5\' 8" )       Chief Complaint / Primary reason  for ICU evaluation:  Pneumomediastinum        70 y.o. 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  He had esophageal balloon dilation twice with the last time in Sept 2022.  He presented to [facility] ER on 05/09/21 after coughing and noting blood from his PEG tube along with mid sternal tightness.    CT scan of the chest did show pneumomediastinum in the lower chest concerning for esophageal perforation.  Transfer to [facility] was requested for thoracic surgery evaluation.      THORACIC SURGERY NOTE:   p/w blood in his PEG.  The bloody output started on Friday ago. He has never had this before. He presented to a hospital in [place] and they have requested transfer to a hospital with thoracic surgery availability. He has difficulty swallowing and has failed multiple outpatient SLP evaluations. He has been getting esophageal  dilations x2. Last was April 04, 2021.      His WBC was 9.3. Other labs notable for a Bun of 29. CT A/P demonstrates air posterior to the mid to lower esophagus without fluid or edema. RLL pneumonia seen. CXR demonstrates RLL haziness.      He had a tracheal resection with stoma creation with a PEG 8 years ago at [facility].     OSH CT Chest reviewed with radiology: air posterior to the mid to lower esophagus without fluid or edema evident. Unable to determine timing for the onset of injury. RLL pneumonia seen    Problem list:  Pneumomediastinum  HTN  HLD  Hypothyroidism  GERD  H/o Throat cancer  S/p Stoma creation  S/p PEG tube  S/p esophageal dilation   Diagnosis    Pneumomediastinum     Assessment:  70 y.o. male here with pneumomediastinum and bloody PEG output c/f esophageal perforation. Patient is stable and if he continues to do well clinically we may be able to treat this nonoperatively    Plan:  - Consult GI for endoscopy to evaluate esophageal perforation  and GI bleed   - PPI  - Broad spectrum antibiotic coverage: zosyn, antifungal  - NPO  - Hold TF until imaging studies result  - mIVF  - Daily CXR      MEDS:  Selected Meds IV:  Current Facility-Administered Medications   Medication Dose Route Frequency    fluconazole  400 mg Intravenous Q24H    levothyroxine  37.5 mcg Intravenous Daily at 0600    metroNIDAZOLE  500 mg Intravenous Q8H SCH    pantoprazole  40 mg Intravenous BID    piperacillin-tazobactam  4.5 g Intravenous Q8H     IV Fluconazole, IV Metronidazole, IV Piptazo, IV PPI, IV Levothyroxine    ============================================================.  Continued Stay Review: 05/11/21.    Seen by GI for GI Bleed:  Reason for Consultation:   GI Bleed   Assessment and Recommendations:   GI bleed  - blood via PEG as of 10/21 with coughing, no further bleeding or melena   - Hgb 10.5 10/23 > 12.1 as of 10/22. Baseline Hgb 13.1 12/2020   Esophageal stricture  - hx supraglottic squamous cell carcinoma s/p radiation  - Last EGD 03/2021- severe stricture (0mm- 16mm)  - CT Abdomen/Pelvis 10/22 with nonspecific pneumomediastinum in the lower chest, liquid stool in colon  - CT Chest 10/22 with linear pneumomediastinum, seen in from their proximal/mid periesophageal     Plan:  Continue supportive care with antibiotics and pain management.   Continue PPI and monitor for GI bleeding  Can resume PEG feedings- no oral intake  No current recommendations for GI intervention due to high risk complication with history of severe stenotic stricture, recent dilation, and abnormal CT findings.   ...................    Critical Care Attending:  DX:    Pneumomediastinum      Pneumomediastinum  HTN  HLD  Hypothyroidism  GERD  H/o Throat cancer  S/p Stoma creation  S/p PEG tube  S/p esophageal dilation     PLAN:  Critical Care services by organ system  NEURO:   NAI     CV:   # HTN  - hold home antihypertensives for now  - Home agents include:  Norvasc 10 mg qd, Hydralazine 50 mg bid,  atorvastatin 20 mg qHS  - check 12 lead  - TNI negative at OSH  - BNP 170 at OSH  Blood pressure goal: MAP >65,  SBP <160     PULM:   # h/o throat cancer with stoma  - stable on RA     GI:   # Possible esophageal perforation with pneumomediastinum  # bleeding from PEG  # h/o throat cancer  # s/p stoma and PEG  - Evaluated by thoracic surgery - no intervention needed. Recommend GI  -GI consulted to eval for GIB/EGD for perf     RENAL:   NAI  I/O Goal: AR     ID:   # possible esophageal perforation  - Low suspicion for frank esophageal perforation - can continue abx for now pending GI evaluation  - Zosyn  - Metronidazole  - Fluconazole     HEME:   Check CBC, coags, send type/screen  SCD's  Pharmacologic ppx: not safe secondary to bleeding from PEG     ENDO/RHEUM:   # Hypothyroidism  - Synthroid 75 mcg qam (will order IV 37.5 mcg given possible esophageal perf)  Glucose: goal 100 -180    VS: BP 97/60    Pulse 65    Temp 98.4 F (36.9 C) (Oral)    Resp 17    Ht 1.727 m (5\' 8" )    Wt 59.9 kg (132 lb) Comment: stated   SpO2 96%    BMI 20.07 kg/m :         MEDS:  Continuous IV Drips/gtts Meds:  Selected Meds IV:  Current Facility-Administered Medications   Medication Dose Route Frequency    fluconazole  400 mg Intravenous Q24H    levothyroxine  37.5 mcg Intravenous Daily at 0600    metroNIDAZOLE  500 mg Intravenous Q8H SCH    pantoprazole  40 mg Intravenous BID    piperacillin-tazobactam  4.5 g Intravenous Q8H       If this transmission is incomplete or not received in good order, please advise this sender  Nedra Hai RN) at  [phone] . Thank you.    Rads: XR Chest AP Portable    Result Date: 05/10/2021  No acute cardiopulmonary process. Clide Cliff, MD  05/10/2021 3:15 PM      Recent Labs     05/11/21  0123 05/10/21  1448   BUN 23.0 29.0*   Creatinine 0.9 0.9   EGFR >60.0 >60.0   Sodium 138 139   Potassium 3.9 4.2   Calcium 8.6 9.4   Chloride 106 103   Glucose 82 92   CO2 24 26     Recent Labs     05/11/21  0123  05/10/21  1448   WBC 7.94 9.27   Hgb 10.5* 12.4*   Hematocrit 32.3* 37.7   Platelets 166 183   PT INR  --  1.2*   AST (SGOT)  --  35   ALT  --  50   Alkaline Phosphatase  --  72       UTILIZATION REVIEW CONTACT: Name: Patty Sermons RN BSN  Clinical Case Manager  - Utilization Review  [facility]  [address] [place], [place]  [address]  NPI:   [id number]  Tax ID:  [id number]  Phone:  [phone]   Fax: [phone]  Main UR Department Phone: [phone]     Please use fax number [phone] to provide authorization for hospital services or to request additional information.         NOTES TO REVIEWER:     This clinical review is based on/compiled from documentation provided by the treatment team within the patient's medical record.

## 2021-05-11 NOTE — Plan of Care (Signed)
Problem: Safety  Goal: Patient will be free from injury during hospitalization  Outcome: Progressing  Flowsheets (Taken 05/11/2021 0200 by Keane Police, RN)  Patient will be free from injury during hospitalization:   Assess patient's risk for falls and implement fall prevention plan of care per policy   Provide and maintain safe environment   Use appropriate transfer methods   Ensure appropriate safety devices are available at the bedside   Include patient/ family/ care giver in decisions related to safety   Hourly rounding   Assess for patients risk for elopement and implement Elopement Risk Plan per policy  Goal: Patient will be free from infection during hospitalization  Outcome: Progressing     Problem: Pain  Goal: Pain at adequate level as identified by patient  Outcome: Progressing  Flowsheets (Taken 05/11/2021 0200 by Keane Police, RN)  Pain at adequate level as identified by patient:   Identify patient comfort function goal   Assess for risk of opioid induced respiratory depression, including snoring/sleep apnea. Alert healthcare team of risk factors identified.   Assess pain on admission, during daily assessment and/or before any "as needed" intervention(s)   Reassess pain within 30-60 minutes of any procedure/intervention, per Pain Assessment, Intervention, Reassessment (AIR) Cycle   Evaluate if patient comfort function goal is met   Evaluate patient's satisfaction with pain management progress   Offer non-pharmacological pain management interventions   Include patient/patient care companion in decisions related to pain management as needed     Problem: Side Effects from Pain Analgesia  Goal: Patient will experience minimal side effects of analgesic therapy  Outcome: Progressing     Problem: Discharge Barriers  Goal: Patient will be discharged home or other facility with appropriate resources  Outcome: Progressing  Flowsheets (Taken 05/11/2021 1301)  Discharge to home or other facility with  appropriate resources:   Provide appropriate patient education   Provide information on available health resources   Initiate discharge planning     Problem: Psychosocial and Spiritual Needs  Goal: Demonstrates ability to cope with hospitalization/illness  Outcome: Progressing  Flowsheets (Taken 05/11/2021 1301)  Demonstrates ability to cope with hospitalizations/illness:   Encourage verbalization of feelings/concerns/expectations   Provide quiet environment   Encourage participation in diversional activity     Problem: Moderate/High Fall Risk Score >5  Goal: Patient will remain free of falls  Outcome: Progressing  Flowsheets (Taken 05/11/2021 0800)  Moderate Risk (6-13):   MOD-Consider activation of bed alarm if appropriate   MOD-Apply bed exit alarm if patient is confused   MOD-Floor mat at bedside (where available) if appropriate   MOD-Remain with patient during toileting   MOD-Perform dangle, stand, walk (DSW) prior to mobilization   MOD-include family in multidisciplinary POC discussions

## 2021-05-11 NOTE — Plan of Care (Signed)
[facility] Nursing Progress Note    Charles Peterson is a 70 y.o. male  Admitted 05/10/2021 12:42 PM (Hospital day 1)    Mews Score: 2    Major Shift Events:  Hemoglobin downtrending to 10.5, NP aware    Review of Systems  Neuro:  Aox4, FC, MAE, walks independently, no c/o pain    Cardiac:  Sinus brady to normal sinus, SBP 80s-110s, HR 50s-70s, afeb, no edema    Respiratory:  RA, clear lung sounds, lary tube, no desats    GI/GU:  Bm x1, large unmeasured output, NPO, PEG tube clamped    BM this shift? yes    Skin Assessment  Skin Integrity: Cracking    Braden Score: Braden Scale Score: 19 (05/10/21 2000)    Patient admitted/transferred to this unit this shift? no    LDAWs  Patient Lines/Drains/Airways Status       Active Lines, Drains and Airways       Name Placement date Placement time Site Days    Peripheral IV 05/10/21 20 G Anterior;Right Forearm 05/10/21  0000  Forearm  1    Peripheral IV 05/10/21 20 G Anterior;Left;Proximal Forearm 05/10/21  1500  Forearm  less than 1    Gastrostomy/Enterostomy Gastrostomy 05/10/21  1900  --  less than 1                        Problem: Safety  Goal: Patient will be free from injury during hospitalization  Outcome: Progressing  Flowsheets (Taken 05/11/2021 0200)  Patient will be free from injury during hospitalization:   Assess patient's risk for falls and implement fall prevention plan of care per policy   Provide and maintain safe environment   Use appropriate transfer methods   Ensure appropriate safety devices are available at the bedside   Include patient/ family/ care giver in decisions related to safety   Hourly rounding   Assess for patients risk for elopement and implement Elopement Risk Plan per policy  Goal: Patient will be free from infection during hospitalization  Outcome: Progressing  Flowsheets (Taken 05/11/2021 0200)  Free from Infection during hospitalization:   Assess and monitor for signs and symptoms of infection   Monitor lab/diagnostic results   Monitor all  insertion sites (i.e. indwelling lines, tubes, urinary catheters, and drains)   Encourage patient and family to use good hand hygiene technique     Problem: Pain  Goal: Pain at adequate level as identified by patient  Outcome: Progressing  Flowsheets (Taken 05/11/2021 0200)  Pain at adequate level as identified by patient:   Identify patient comfort function goal   Assess for risk of opioid induced respiratory depression, including snoring/sleep apnea. Alert healthcare team of risk factors identified.   Assess pain on admission, during daily assessment and/or before any "as needed" intervention(s)   Reassess pain within 30-60 minutes of any procedure/intervention, per Pain Assessment, Intervention, Reassessment (AIR) Cycle   Evaluate if patient comfort function goal is met   Evaluate patient's satisfaction with pain management progress   Offer non-pharmacological pain management interventions   Include patient/patient care companion in decisions related to pain management as needed     Problem: Side Effects from Pain Analgesia  Goal: Patient will experience minimal side effects of analgesic therapy  Outcome: Progressing  Flowsheets (Taken 05/11/2021 0200)  Patient will experience minimal side effects of analgesic therapy:   Monitor/assess patient's respiratory status (RR depth, effort, breath sounds)   Assess for changes in  cognitive function   Prevent/manage side effects per LIP orders (i.e. nausea, vomiting, pruritus, constipation, urinary retention, etc.)   Evaluate for opioid-induced sedation with appropriate assessment tool (i.e. POSS)     Problem: Discharge Barriers  Goal: Patient will be discharged home or other facility with appropriate resources  Outcome: Progressing  Flowsheets (Taken 05/11/2021 0200)  Discharge to home or other facility with appropriate resources: Provide appropriate patient education     Problem: Psychosocial and Spiritual Needs  Goal: Demonstrates ability to cope with  hospitalization/illness  Outcome: Progressing  Flowsheets (Taken 05/11/2021 0200)  Demonstrates ability to cope with hospitalizations/illness:   Encourage verbalization of feelings/concerns/expectations   Provide quiet environment     Problem: Moderate/High Fall Risk Score >5  Goal: Patient will remain free of falls  Outcome: Progressing  Flowsheets (Taken 05/10/2021 2000)  Moderate Risk (6-13):   MOD-Consider activation of bed alarm if appropriate   MOD-Apply bed exit alarm if patient is confused   MOD-Floor mat at bedside (where available) if appropriate   MOD-Consider a move closer to Nurses Station   MOD-Remain with patient during toileting   MOD-Perform dangle, stand, walk (DSW) prior to mobilization   MOD-Utilize diversion activities

## 2021-05-12 LAB — BASIC METABOLIC PANEL
Anion Gap: 5 (ref 5.0–15.0)
BUN: 19 mg/dL (ref 9.0–28.0)
CO2: 26 mEq/L (ref 17–29)
Calcium: 8.7 mg/dL (ref 7.9–10.2)
Chloride: 109 mEq/L (ref 99–111)
Creatinine: 0.8 mg/dL (ref 0.5–1.5)
Glucose: 48 mg/dL — CL (ref 70–100)
Potassium: 3.7 mEq/L (ref 3.5–5.3)
Sodium: 140 mEq/L (ref 135–145)

## 2021-05-12 LAB — CBC
Absolute NRBC: 0 10*3/uL (ref 0.00–0.00)
Hematocrit: 33.3 % — ABNORMAL LOW (ref 37.6–49.6)
Hgb: 10.6 g/dL — ABNORMAL LOW (ref 12.5–17.1)
MCH: 23.9 pg — ABNORMAL LOW (ref 25.1–33.5)
MCHC: 31.8 g/dL (ref 31.5–35.8)
MCV: 75.2 fL — ABNORMAL LOW (ref 78.0–96.0)
MPV: 11.5 fL (ref 8.9–12.5)
Nucleated RBC: 0 /100 WBC (ref 0.0–0.0)
Platelets: 167 10*3/uL (ref 142–346)
RBC: 4.43 10*6/uL (ref 4.20–5.90)
RDW: 16 % — ABNORMAL HIGH (ref 11–15)
WBC: 7.5 10*3/uL (ref 3.10–9.50)

## 2021-05-12 LAB — GLUCOSE WHOLE BLOOD - POCT
Whole Blood Glucose POCT: 53 mg/dL — CL (ref 70–100)
Whole Blood Glucose POCT: 262 mg/dL — ABNORMAL HIGH (ref 70–100)
Whole Blood Glucose POCT: 195 mg/dL — ABNORMAL HIGH (ref 70–100)

## 2021-05-12 LAB — GFR: EGFR: >60

## 2021-05-12 MED ORDER — DEXTROSE 5% IV BOLUS
250.0000 mL | INTRAVENOUS | Status: DC | PRN
Start: 2021-05-12 — End: 2021-05-14

## 2021-05-12 MED ORDER — DEXTROSE 50 % IV SOLN
25.0000 g | INTRAVENOUS | Status: DC | PRN
Start: 2021-05-12 — End: 2021-05-14

## 2021-05-12 MED ORDER — DEXTROSE 10 % IV BOLUS
25.0000 g | INTRAVENOUS | Status: DC | PRN
Start: 2021-05-12 — End: 2021-05-14
  Administered 2021-05-12: 02:00:00 250 mL via INTRAVENOUS

## 2021-05-12 MED ORDER — GLUCAGON 1 MG IJ SOLR (WRAP)
1.0000 mg | INTRAMUSCULAR | Status: DC | PRN
Start: 2021-05-12 — End: 2021-05-14

## 2021-05-12 MED ORDER — LEVOTHYROXINE SODIUM 50 MCG PO TABS
75.0000 ug | ORAL_TABLET | Freq: Every day | ORAL | Status: DC
Start: 2021-05-13 — End: 2021-05-14
  Administered 2021-05-13 – 2021-05-14 (×2): 75 ug via ORAL
  Filled 2021-05-12 (×2): qty 1

## 2021-05-12 MED ORDER — POTASSIUM CHLORIDE 20 MEQ PO PACK
40.0000 meq | PACK | Freq: Once | ORAL | Status: AC
Start: 2021-05-12 — End: 2021-05-12
  Administered 2021-05-12: 03:00:00 40 meq via ORAL
  Filled 2021-05-12: qty 2

## 2021-05-12 NOTE — Progress Notes (Signed)
Thoracic Surgery Daily Progress Note  Team Spectra: [phone]      Hospital Day: Hospital Day: 3  Primary Procedure: None    HPI: Charles Peterson is a 70 y.o. male w/ PMH of throat cancer s/p resection by ENT, dysphagia s/p esophageal dilations who p/w blood in his PEG.  The bloody output started on Friday, 3 days ago. He has never had this before. He presented to a hospital in [place] and they have requested transfer to a hospital with thoracic surgery availability. Denies chest pain, SOB, fever, nausea or vomiting. He has difficulty swallowing and has failed multiple outpatient SLP evaluations. He has undergone esophageal dilations x2. Last was April 04, 2021. He says after the first dilation he saw blood in his PEG but did not see blood after this most recent dilation.     CT A/P demonstrates air posterior to the mid to lower esophagus without fluid or edema. RLL pneumonia seen. CXR demonstrates RLL haziness.      He had a tracheal resection with stoma creation with a PEG 8 years ago at [facility].         Assessment:   Charles Peterson is a 70 y.o. male with history of tracheal resection and stoma creation and unclear esophageal surgical history 8 years ago at [facility] with dysphagia requiring esophageal dilations and PEG presenting with blood per PEG and pneumomediastinum.    He has remained clinically stable without leukocytosis or development of pleural effusions.       Plan:   - No indication of esophagram or surgical intervention from Thoracic Surgery perspective  - In setting of pneumomediastinum would recommend Abx and antifungal therapy for total of 7 days (can switch to augmentin suspension via G tube  - Remainder of care per IM team      There are no current Thoracic Surgery plans. Thank you for allowing [place] to be a part of your patients care. Please call with questions.        Signed by: Lenis Dickinson, PA-C  Interventional Pulmonology/Thoracic Surgery Physician Assistant  Thoracic Surgery team spectra  [phone]    Interval History:   NAEON. GI team evaluated patient and do not recommend EGD as this point.     Physical Exam:   Current Vitals:   BP 108/69    Pulse 66    Temp 98 F (36.7 C) (Oral)    Resp 22    Ht 1.727 m (5\' 8" )    Wt 59.9 kg (132 lb) Comment: stated   SpO2 99%    BMI 20.07 kg/m       GEN:  A&O x3, NAD  HEENT: tracheal stoma covered with patch, voice weak and hoarse   COR: RRR  LUNGS: B/L CTA anteriorly, good respiratory effort   ABD: soft, nttp, non-distended, PEG in place   EXTREMITIES: warm, no edema    Laboratory and Radiological Results:     Lab Results   Component Value Date    WBC 7.50 05/12/2021    HGB 10.6 (L) 05/12/2021    HCT 33.3 (L) 05/12/2021    MCV 75.2 (L) 05/12/2021    PLT 167 05/12/2021     Lab Results   Component Value Date    NA 140 05/12/2021    K 3.7 05/12/2021    CL 109 05/12/2021    CO2 26 05/12/2021    BUN 19.0 05/12/2021    CREAT 0.8 05/12/2021

## 2021-05-12 NOTE — Consults (Signed)
Nutrition Consult  Reason for Consult: tube feeding    Recommendations:  Promote goal of 72ml/hour ( 1920 kcals, 120g protein),1670ml free water)    Additional free water 150 ml q 6 hours    Clinical update: Charles Peterson is a(n) 70 y.o. male with PMH throat cancer s/p neck resection and stoma, PEG, HTN,HLD,GERD and gallstones  He has difficulty swallowing and has failed multiple outpatient SLP evaluations.    Anthropometrics:  Ht Readings from Last 1 Encounters:   05/10/21 1.727 m (5\' 8" )     Wt Readings from Last 1 Encounters:   05/10/21 59.9 kg (132 lb)       Anthropometrics  Height: 172.7 cm (5\' 8" )  Weight: 59.9 kg (132 lb) (stated)  IBW/kg (Calculated) Male: 70.02 kg  IBW/kg (Calculated) Male: 63.62 kg  BMI (calculated): 20.1  Body mass index is 20.07 kg/m.      Recent Labs:   Recent Labs   Lab 05/12/21  0034 05/11/21  0123 05/10/21  1448   Sodium 140  More results in Results Review 139   Potassium 3.7  More results in Results Review 4.2   Chloride 109  More results in Results Review 103   CO2 26  More results in Results Review 26   BUN 19.0  More results in Results Review 29.0*   Creatinine 0.8  More results in Results Review 0.9   Calcium 8.7  More results in Results Review 9.4   Albumin  --   --  3.6   Protein, Total  --   --  7.8   Bilirubin, Total  --   --  0.5   Alkaline Phosphatase  --   --  72   ALT  --   --  50   AST (SGOT)  --   --  35   Glucose 48*  More results in Results Review 92   More results in Results Review = values in this interval not displayed.     Recent Medications:levothyroxine,KCL  IV:     Current Diet Order    Orders Placed This Encounter   Procedures    Diet NPO effective now    Ensure Clear Quantity: B. Two; Frequency: BID (2 times a day) - with Breakfast and Dinner    Tube feeding-Continuous     No Known Allergies    Estimated Energy Needs:  Calorie:1800-2100 kcalsday ( 30-35 kcals/kg/CBW)  Protein:72-90g protein/day ( 1.2-1.5g  protein/kg/CBW)  Fluid:82ml/kg/fluids    Monitor/Eval:  PO intake, Labs, GI distress, BMs, Weight trends     Aundra Millet, [place], [place]  #[id number]  Cell [phone]

## 2021-05-12 NOTE — PT Eval Note (Addendum)
IPhysical Therapy Evaluation  Charles Peterson      Post Acute Care Therapy Recommendations:     Discharge Recommendations:  Home with no needs  D/C Milestones: none    DME needs IF patient is discharging home: No additional equipment/DME recommended at this time    Therapy discharge recommendations may change with patient status.  Please refer to most recent note for up-to-date recommendations.    Assessment:   Significant Findings: none    Charles Peterson is a 70 y.o. male admitted 05/10/2021 for concern for esophageal perforation. Patient is at or very close to baseline. Encouraged to continue progressive mobility w/ staff to prevent deconditioning. No voiced concerns about d/c from skilled PT services. Will complete this order.    Treatment Activities: evaluation    Educated the patient to role of physical therapy, plan of care, goals of therapy and energy conservation techniques.    Plan:   Treatment/Interventions: No skilled interventions needed at this time    PT Frequency: one time visit - therapy discontinued    Risks/Benefits/POC Discussed with Pt/Family: With patient       Unit: [facility] TOWER 4  Bed: F432/F432.01    Precautions and Contraindications:  falls    Consult received for Charles Peterson for PT Evaluation and Treatment.  Patients medical condition is appropriate for Physical therapy intervention at this time.    Medical Diagnosis: Pneumomediastinum [J98.2]    History of Present Illness:   Charles Peterson is a 70 y.o. male admitted on 05/10/2021 with " He presented to [facility] ER on 05/09/21 after coughing and noting blood from his PEG tube along with mid sternal tightness. He denied shortness of breath, fatigue, other GI, GU, or neurological complaints. CT scan of the chest did show pneumomediastinum in the lower chest concerning for esophageal perforation. 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 Nursing clears patient for therapy.     Pt Goal:  "Can I have a blanket?"    Pain:  Denies pain    Objective:   Patient is out of bed, ambulating with peripheral IV, PEG, MCCSU monitor in place.  Pt wore mask during therapy session:No           Musculoskeletal Examination:    R LE ROM:wfl  L LE ROM:wfl  R LE Strength: wfl  L LE Strength: wfl      Functional Mobility:  Sit to Stand: indep  Stand to ZOX:WRUEA    Ambulation:  PMP Activity: Step 6 walks in the room  Distance:100'  Assist:Sup/indep (assist is more for line/tube management)  Assistive Device:none  Pattern:wfl  Stairs: modified steps 2x, sup/indep        Balance:  Standing Static:Good  Standing Dynamic:Good, able to retrieve objects  from the floor w/o LOB    Participation:Good  Endurance:Good    Patient left with call bell within reach, all needs met,  SCD: off  Fall mat: off as  found  Bed alarm: n/a  Chair alarm:on  and all questions answered. RN notified of session outcome and patient response.        PPE worn during session: procedural mask  Tech present: none  PPE worn by tech: N/A    Charles Peterson, PT   Pager ID# [phone] 05/12/2021, 10:35 AM    Time of treatment:   PT Received On: 05/12/21  Start Time: 0930  Stop Time: 8242  Time Calculation (min): 25 min

## 2021-05-12 NOTE — Progress Notes (Signed)
Initial Case Management Assessment and Discharge Planning  [facility]   Patient Name: Charles Peterson, Charles Peterson   Date of Birth 05-12-1951   Attending Physician: Andi Hence, DO   Primary Care Physician: Janice Coffin, MD   Length of Stay 2   Reason for Consult / Chief Complaint Pneumomediastinum        Situation   Admission DX:   1. Pneumomediastinum        A/O Status: X 3    LACE Score: 2    Patient admitted from: transfer from [facility] ED  Admission Status: inpatient    Health Care Agent: Self  Name: Charles Peterson, states he is legally married but separated 71yrs. Requests dtr [name] Plan as surrogate  Phone number: [phone]       Background     Advanced directive:   <Charles information>    Code Status:   Full Code     Residence: Other: Single story home with ramp to enter    PCP: Charles Festus Aloe, MD  Patient Contact:   There are Charles phone numbers on file.    There is Charles such number on file (mobile).     Emergency contact:   Extended Emergency Contact Information  Primary Emergency Contact: CharlesPeterson  Mobile Phone: [phone]  Relation: Daughter  Preferred language: English  Interpreter needed? Charles      ADL/IADL's: Independent  Previous Level of function: 7 Independent     DME: None    Pharmacy:     [facility] [address], [place] - [address]  [address]  [place] [address]  Phone: [phone] Fax: [phone]      Prescription Coverage: Yes    Home Health: The patient is not currently receiving home health services.    Previous SNF/AR: NA    COVID Vaccine Status: Unknown    Date First IMM given: NA  UAI on file?: Charles  Transport for discharge? Mode of transportation: Sales executive - Family/Friend to drive patient  Agreeable to Home with family post-discharge:  Yes     Assessment   LCSW reviewed chart, met with pt bedside. Introduced self, role & reason for visit. Assessment completed, Charles barriers to d/c identified, confirmed plan will be home with family, Charles needs. Informed of  continued availability and how to contact.  Pt reports he is separated 16yrs, has 4 adult children (3 dtrs, 1 son). States he is closest to his dtr Charles Peterson, would prefer her to be his surrogate decision maker but denies having Peterson completed. Edu provided & offered to provide blank AD to pt, he declines at this time. Pt reports he is currently indep with ADLs, IADLs, lives with his mother (85y/o) in a [facility] in [place]. 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 Reports she does not drive, he does the driving. Reports he is disabled, collects disability since 2008. 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 Pt denies any hx of SNF or ARU.   Pt reports Charles hx of mental health issues, denies any current ETOH or tobacco use.   Confirms dtr can provide add'l assistance if needed upon d/c.   BARRIERS TO DISCHARGE: None     Recommendation   D/C Plan A: Home with family    D/C Plan B: Home with family    D/C Plan C: Home with family       LCSW  to continue to follow, anticipate home when stable with Charles needs.    Kellie Shropshire, LCSW  Social Worker/Case Manager I  [phone]

## 2021-05-12 NOTE — Consults (Addendum)
Office: [phone]  [employer] Group[employer]: "[facility])"     Date Time: 05/12/21 5:03 PM  Patient Name: Charles Peterson  Requesting Physician: Andi Hence, DO     Reason for Consultation:   Pneumomediastinum    Problem List:   Acute Problem List:   Throat cancer (8 years ago) s/p neck resection, radiation, and stoma creation, PEG tube placement  Esophageal strictures  Esophageal balloon dilation, 01/2021 and 04/04/21    Pneumomediastinum  -Blood in the PEG tube   -CT at the OSH     ID History:     Chronic Conditions:  HTN  GERD  Hypothyroidism  NKDA    Antimicrobials:   #2 pip-tazo 4.5 gm IV q8 hours  #3 fluconazole 400 mg IV daily    Metronidazole x2 days    Estimated Creatinine Clearance: 72.8 mL/min (based on SCr of 0.8 mg/dL).    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  He noted blood in his PEG tube.  He has been evaluated by GI and thoracic surgery and conservative management is planned.  He certainly may have had a small esophageal perforation which has been contained/self limited.  He appears clinically stable.  No fevers, WBC 8K.  Blood cx 10/22 NGTD.    Recommendations:   Agree with pip-tazo and fluconazole for now.    Will need to clarify with thoracic surgery and GI when/if repeat imaging is planned.    History:   Charles Peterson is a 70 y.o. male who presents to the hospital on 05/10/2021 as a transfer due to concerns for an esophageal perforation.  He had been coughing and noted some blood in his PEG tube.  He went to his local ER and a CT revealed concerns for an esophageal perforation.  He is doing better, no esophageal pain.  No fevers.  He has been seen by thoracic surgery and GI.    Lines:     Patient Lines/Drains/Airways  Status       Active PICC Line / CVC Line / PIV Line / Drain / Airway / Intraosseous Line / Epidural Line / ART Line / Line / Wound / Pressure Ulcer / NG/OG Tube       Name Placement date Placement time Site Days    Peripheral IV 05/10/21 20 G Anterior;Right Forearm 05/10/21  0000  Forearm  2    Peripheral IV 05/10/21 20 G Anterior;Left;Proximal Forearm 05/10/21  1500  Forearm  2    Gastrostomy/Enterostomy Gastrostomy 05/10/21  1900  --  1                    *I have performed a risk-benefit analysis and the patient needs a central line for access and IV medications      Review of Systems:   A comprehensive ROS was negative except as per HPI.     Physical Exam:   Visit Vitals  BP 106/68   Pulse 61   Temp 97.8 F (36.6 C) (Oral)   Resp 21   Ht 1.727 m (5\' 8" )   Wt 59.9 kg (132 lb)   SpO2 99%   BMI 20.07 kg/m      General Appearance: non-toxic  Neuro: awake, alert, grossly non-focal  HEENT:  no scleral icterus, OP clear  Neck: sunken in appearance, dressing over the stoma  Lungs: decreased bs b/l, no wheezes, rales or rhonchi   Cardiac: normal rate, regular rhythm   Abdomen: soft, benign, PEG in place, no erythema  Extremities: very pedal edema b/l  Skin: no obvious rash  Psych: normal and calm affect     Past Medical History:     Past Medical History:   Diagnosis Date    Esophageal dilatation     Gastroesophageal reflux disease     Hypertension     PEG (percutaneous endoscopic gastrostomy) status     Throat cancer        Past Surgical History:     Past Surgical History:   Procedure Laterality Date    NECK SURGERY         Family History:   History reviewed. No pertinent family history.    Social History:     Social History     Socioeconomic History    Marital status: Unknown     Spouse name: Not on file    Number of children: Not on file    Years of education: Not on file    Highest education level: Not on file   Occupational History    Not on file   Tobacco Use    Smoking status: Former     Types: Cigarettes     Quit  date: 05/05/2001     Years since quitting: 20.0    Smokeless tobacco: Never   Vaping Use    Vaping Use: Never used   Substance and Sexual Activity    Alcohol use: Never    Drug use: Never    Sexual activity: Not Currently   Other Topics Concern    Not on file   Social History Narrative    Not on file     Social Determinants of Health     Financial Resource Strain: Low Risk     Difficulty of Paying Living Expenses: Not very hard   Food Insecurity: Not on file   Transportation Needs: No Transportation Needs    Lack of Transportation (Medical): No    Lack of Transportation (Non-Medical): No   Physical Activity: Not on file   Stress: Not on file   Social Connections: Not on file   Intimate Partner Violence: Not on file   Housing Stability: Low Risk     Unable to Pay for Housing in the Last Year: No    Number of Places Lived in the Last Year: 1    Unstable Housing in the Last Year: No       Allergies:   No Known Allergies      Medications:     Current Facility-Administered Medications   Medication Dose Route Frequency    fluconazole  400 mg Intravenous Q24H    [START ON 05/13/2021] levothyroxine  75 mcg Oral Daily at 0600    pantoprazole  40 mg Intravenous BID    piperacillin-tazobactam  4.5 g Intravenous Q8H       Labs:     Lab Results   Component Value Date    WBC 7.50 05/12/2021    HGB 10.6 (L) 05/12/2021    HCT 33.3 (L) 05/12/2021    MCV 75.2 (L) 05/12/2021    PLT 167 05/12/2021     Lab Results   Component Value Date    CREAT 0.8 05/12/2021     Lab Results   Component Value Date  ALT 50 05/10/2021    AST 35 05/10/2021    ALKPHOS 72 05/10/2021    BILITOTAL 0.5 05/10/2021     No results found for: LACTATE    Microbiology:     Microbiology Results (last 15 days)       Procedure Component Value Units Date/Time    Culture Blood Aerobic and Anaerobic [[id number]] Collected: 05/10/21 1442    Order Status: Completed Specimen: Blood, Venipuncture Updated: 05/12/21 1621    Narrative:      The order will result in two separate  8-14ml bottles  Please do NOT order repeat blood cultures if one has been  drawn within the last 48 hours  UNLESS concerned for  endocarditis  AVOID BLOOD CULTURE DRAWS FROM CENTRAL LINE IF POSSIBLE  Indications:->Sepsis  ORDER#: [id number]                                    ORDERED BY: LEE, THOMAS  SOURCE: Blood, Venipuncture                          COLLECTED:  05/10/21 14:42  ANTIBIOTICS AT COLL.:                                RECEIVED :  05/10/21 16:19  Culture Blood Aerobic and Anaerobic        PRELIM      05/12/21 16:21  05/11/21   No Growth after 1 day/s of incubation.  05/12/21   No Growth after 2 day/s of incubation.      Culture Blood Aerobic and Anaerobic [[id number]] Collected: 05/10/21 1442    Order Status: Completed Specimen: Blood, Venipuncture Updated: 05/12/21 1621    Narrative:      The order will result in two separate 8-14ml bottles  Please do NOT order repeat blood cultures if one has been  drawn within the last 48 hours  UNLESS concerned for  endocarditis  AVOID BLOOD CULTURE DRAWS FROM CENTRAL LINE IF POSSIBLE  Indications:->Sepsis  ORDER#: [id number]                                    ORDERED BY: LEE, THOMAS  SOURCE: Blood, Venipuncture                          COLLECTED:  05/10/21 14:42  ANTIBIOTICS AT COLL.:                                RECEIVED :  05/10/21 16:19  Culture Blood Aerobic and Anaerobic        PRELIM      05/12/21 16:21  05/11/21   No Growth after 1 day/s of incubation.  05/12/21   No Growth after 2 day/s of incubation.      MRSA culture [[id number]] Collected: 05/10/21 1426    Order Status: Completed Specimen: Culturette from Nasal Swab Updated: 05/11/21 1554     Culture MRSA Surveillance Negative for Methicillin Resistant Staph aureus    MRSA culture [[id number]] Collected: 05/10/21 1426    Order Status: Completed Specimen: Culturette from Throat Updated: 05/11/21 1554     Culture MRSA Surveillance Negative for  Methicillin Resistant Staph aureus            Rads:   No results  found.    Signed by: Marga Hoots, MD

## 2021-05-12 NOTE — Nursing Progress Note (Signed)
[facility] Nursing Progress Note    Charles Peterson is a 70 y.o. male  Admitted 05/10/2021 12:42 PM (Hospital day 2)    Review of Systems  Neuro:  AAOx4, FC, MAE, no c/o pain  Stand by assist to bathroom with IV pole  Lary tube in place, patient uses written communication to verbalize needs    Cardiac:  Normal sinus to sinus brady HR upper 50's-60's, SBP 100-120's, PP +2, no edema, afebrile    Respiratory:  On RA and diminished    GI/GU:  Promote feeds via PEG running at goal of 15ml/hr   3 BM today  100 cc out via urinal and multiple unmeasured in toilet      LDAWs  Patient Lines/Drains/Airways Status       Active Lines, Drains and Airways       Name Placement date Placement time Site Days    Peripheral IV 05/10/21 20 G Anterior;Right Forearm 05/10/21  0000  Forearm  2    Peripheral IV 05/10/21 20 G Anterior;Left;Proximal Forearm 05/10/21  1500  Forearm  2    Gastrostomy/Enterostomy Gastrostomy 05/10/21  1900  --  1

## 2021-05-12 NOTE — OT Eval Note (Signed)
Occupational Therapy Eval Charles Peterson        Post Acute Care Therapy Recommendations:     Discharge Recommendations:  Home with no needs  D/C Milestones: none Anticipate achievement in na sessions    DME needs IF patient is discharging home: No additional equipment/DME recommended at this time    Therapy discharge recommendations may change with patient status.  Please refer to most recent note for up-to-date recommendations.  Assessment:   Significant Findings: none    Charles Peterson is a 70 y.o. male admitted 05/10/2021 for pneumomediastinum.  Pt demonstrating ability to complete BADLs and functional mobility/transfers safely with supervision/ Mod I. Pt has no acute care OT needs. D/C OT.      Therapy Diagnosis: decreased activity tolerance     Rehabilitation Potential: D/C OT     Treatment Activities: Evaluation only  Educated the patient to role of occupational therapy, plan of care, goals of therapy and safety with mobility and ADLs.    Plan:   D/C acute OT services      Risks/benefits/POC discussed with patient        Precautions and Contraindications:   Falls       Consult received for Charles Peterson for OT Evaluation and Treatment.  Patients medical condition is appropriate for Occupational Therapy intervention at this time.      History of Present Illness:    Charles Peterson is a 70 y.o. 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  He had esophageal balloon dilation twice with the last time in Sept 2022.  He presented to [facility] ER on 05/09/21 after coughing and noting blood from his PEG tube along with mid sternal tightness.  He denied shortness of breath, fatigue, other GI, GU, or neurological complaints.   CT scan of the chest did show pneumomediastinum in the lower chest concerning for esophageal perforation.  Transfer to [facility] was  requested for thoracic surgery evaluation." -per H&P    Admitting Diagnosis: Pneumomediastinum [J98.2]    Past Medical/Surgical History:  Past Medical History:   Diagnosis Date    Esophageal dilatation     Gastroesophageal reflux disease     Hypertension     PEG (percutaneous endoscopic gastrostomy) status     Throat cancer      Past Surgical History:   Procedure Laterality Date    NECK SURGERY           Imaging/Tests/Labs:  XR Chest AP Portable    Result Date: 05/10/2021  No acute cardiopulmonary process. Clide Cliff, MD  05/10/2021 3:15 PM     Social History:   Prior Level of Function:Independent with ADLs and Ambulates Independently  Baseline Activity: community, drives daily  DME Currently at Home: none  Home Living Arrangements:  mother who pt is the caregiver for  Type of Home: House       Home Layout: One Level  Bathroom Layout: tub/shower unit     Subjective: " It's been low since I been here" - referring to BP    Patient is agreeable to participation in the therapy session. Nursing clears patient for therapy.     Patient Goal: none stated   Pain:   Scale: denies   Location: na  Intervention:  na    Objective:   Patient is in bed with peripheral IV, MCCSU monitors, PEG in place.  Pt wore  mask during therapy session:No       Cognitive Status and Neuro Exam:  Arousal/Alertness: Appropriate responses to stimuli   Attention Span:appears intact  Orientation Level: Oriented x4  Memory: appears intact  Following Commands: All commands without difficulty         Motor Planning: Intact          Musculoskeletal Examination  Gross ROM: WFL  Gross UE Strength: WFL      Sensory/Oculomotor Examination  Auditory: WFL  Tactile: WFL  Vision: WFL      Activities of Daily Living  Eating: Independent  Grooming: Independent  Bathing: Independent  UE Dressing: Independent  LE Dressing: modified Independent seated at bedside  Tolieting: Independent    Functional Mobility:  Supine to Sit: Independent  Sit to Stand:  Independent  Transfers: supervision     PMP - Progressive Mobility Protocol   PMP Activity: Step 6 - Walks in Room  Distance Walked (ft) (Step 6,7): 100 Feet     Balance  Static Sitting: good   Dynamic Sitting: good  Static Standing: good   Dynamic Standing: good    Participation and Activity Tolerance  Participation Effort: good   Endurance: good    Patient left with call bell within reach, all needs met, SCDs as found fall mat in place , bed alarm on, chair alarm na and all questions answered. RN notified of session outcome and patient response.     Goals: D/C OT                                   PPE worn during session: procedural mask and gloves    Tech present: no  PPE worn by tech: N/A    Charles Fuel, MS, OTR/L  Pager [phone]    Time of treatment:   OT Received On: 05/12/21  Start Time: 0906  Stop Time: 0932  Time Calculation (min): 26 min

## 2021-05-12 NOTE — Plan of Care (Signed)
[facility] Nursing Progress Note    Charles Peterson is a 70 y.o. male  Admitted 05/10/2021 12:42 PM [facility] day 2)    Indication for continued [facility] status:  N/a downgraded    Mews Score: 1        Major Shift Events:  Replaced 40 mEq potassium  critical lab value  glucose of 48, 250 mL of D10 given.  TF started through PEG per orders, now at goal rate of 45mL/hr    Review of Systems  Neuro:  Aox4, FC, MAE, no c/o pain    Cardiac:  Afeb, normal sinus to sinus brady, HR 50s-70s, SBP 90s-130s, PP, no edema    Respiratory:  RA, clear lung sounds    GI/GU:  TF started through PEG per orders, now at goal rate of 66mL/hr and tolerating well, no BM this shift    BM this shift? no    Skin Assessment  Skin Integrity: Cracking    Braden Score: Braden Scale Score: 20 (05/11/21 2000)    Patient admitted/transferred to this unit this shift?no    LDAWs  Patient Lines/Drains/Airways Status       Active Lines, Drains and Airways       Name Placement date Placement time Site Days    Peripheral IV 05/10/21 20 G Anterior;Right Forearm 05/10/21  0000  Forearm  2    Peripheral IV 05/10/21 20 G Anterior;Left;Proximal Forearm 05/10/21  1500  Forearm  1    Gastrostomy/Enterostomy Gastrostomy 05/10/21  1900  --  1                          Problem: Safety  Goal: Patient will be free from injury during hospitalization  Outcome: Progressing  Flowsheets (Taken 05/11/2021 0200)  Patient will be free from injury during hospitalization:   Assess patient's risk for falls and implement fall prevention plan of care per policy   Provide and maintain safe environment   Use appropriate transfer methods   Ensure appropriate safety devices are available at the bedside   Include patient/ family/ care giver in decisions related to safety   Hourly rounding   Assess for patients risk for elopement and implement Elopement Risk Plan per policy  Goal: Patient will be free from infection during hospitalization  Outcome: Progressing  Flowsheets (Taken 05/11/2021  0200)  Free from Infection during hospitalization:   Assess and monitor for signs and symptoms of infection   Monitor lab/diagnostic results   Monitor all insertion sites (i.e. indwelling lines, tubes, urinary catheters, and drains)   Encourage patient and family to use good hand hygiene technique     Problem: Pain  Goal: Pain at adequate level as identified by patient  Outcome: Progressing  Flowsheets (Taken 05/11/2021 0200)  Pain at adequate level as identified by patient:   Identify patient comfort function goal   Assess for risk of opioid induced respiratory depression, including snoring/sleep apnea. Alert healthcare team of risk factors identified.   Assess pain on admission, during daily assessment and/or before any "as needed" intervention(s)   Reassess pain within 30-60 minutes of any procedure/intervention, per Pain Assessment, Intervention, Reassessment (AIR) Cycle   Evaluate if patient comfort function goal is met   Evaluate patient's satisfaction with pain management progress   Offer non-pharmacological pain management interventions   Include patient/patient care companion in decisions related to pain management as needed     Problem: Side Effects from Pain Analgesia  Goal: Patient will experience  minimal side effects of analgesic therapy  Outcome: Progressing  Flowsheets (Taken 05/11/2021 0200)  Patient will experience minimal side effects of analgesic therapy:   Monitor/assess patient's respiratory status (RR depth, effort, breath sounds)   Assess for changes in cognitive function   Prevent/manage side effects per LIP orders (i.e. nausea, vomiting, pruritus, constipation, urinary retention, etc.)   Evaluate for opioid-induced sedation with appropriate assessment tool (i.e. POSS)     Problem: Discharge Barriers  Goal: Patient will be discharged home or other facility with appropriate resources  Outcome: Progressing  Flowsheets (Taken 05/12/2021 0504)  Discharge to home or other facility with appropriate  resources: Provide appropriate patient education     Problem: Psychosocial and Spiritual Needs  Goal: Demonstrates ability to cope with hospitalization/illness  Outcome: Progressing  Flowsheets (Taken 05/12/2021 0504)  Demonstrates ability to cope with hospitalizations/illness:   Encourage verbalization of feelings/concerns/expectations   Provide quiet environment     Problem: Moderate/High Fall Risk Score >5  Goal: Patient will remain free of falls  Outcome: Progressing  Flowsheets (Taken 05/12/2021 0504)  Moderate Risk (6-13):   MOD-Consider activation of bed alarm if appropriate   MOD-Floor mat at bedside (where available) if appropriate   MOD-Remain with patient during toileting   MOD-Perform dangle, stand, walk (DSW) prior to mobilization

## 2021-05-12 NOTE — Plan of Care (Signed)
Patient hypoglycemic- glucose of 48. 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 Ordered hypoglycemia protocol.     Reviewed GI note- may resume tube feeds. No plan for endoscopic procedure. Ordered enteral nutrition orderset with tube feeding promote.    Alfred Levins, MD  [place]

## 2021-05-12 NOTE — Progress Notes (Addendum)
MEDICINE PROGRESS NOTE    Date Time: 05/12/21 3:04 PM  Patient Name: Charles Peterson  Attending Physician: Andi Hence, DO    Assessment:   Charles Peterson is a 70 y.o. 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  He had esophageal balloon dilation twice with the last time in Sept 2022.  He presented to [facility] ER on 05/09/21 after coughing and noting blood from his PEG tube along with mid sternal tightness.  He denied shortness of breath, fatigue, other GI, GU, or neurological complaints.   CT scan of the chest did show pneumomediastinum in the lower chest concerning for esophageal perforation.  Transfer to [facility] was requested for thoracic surgery evaluation.      Plan:   Pneumomediastinum  HTN  HLD  Hypothyroidism  GERD  H/o Throat cancer  S/p Stoma creation  S/p PEG tube  S/p esophageal dilation     Subjective: No events overnight. Pt denies fever, chills, chest pain, SOB, abdominal pain, suprapubic pain and new or worsening LE edema      Plan:   # Possible esophageal perforation with pneumomediastinum  # bleeding from PEG  # h/o throat cancer  # s/p stoma and PEG  - Evaluated by thoracic surgery - no intervention needed. Recommend GI  -GI consulted to eval for GIB/EGD for perf - no intervention  - Cont PPI    # Pneumomediastinum   - Low suspicion for frank esophageal perforation  - Zosyn  - [place] Metronidazole  - Fluconazole  - ID consulted    # Hypothyroidism  - Synthroid 75 mcg qam (will order IV 37.5 mcg given possible esophageal perf)  Glucose: goal 100 -180    #Hypoglycemia   - hypoglycemic protocol    # HTN  - hold home antihypertensives for now  - Home agents include:  Norvasc 10 mg qd, Hydralazine 50 mg bid, atorvastatin 20 mg qHS  - check 12 lead  - TNI negative at OSH  - BNP 170 at OSH  Blood pressure goal: MAP >65, SBP <160    Pharmacologic ppx: not safe  secondary to bleeding from PEG        Case discussed with: RN, Patient     Safety Checklist:     DVT prophylaxis:  CHEST guideline (See page [id number]) Mechanical   Foley:  [place] Rn Foley protocol Not present   IVs:  Peripheral IV   PT/OT: Not needed   Daily CBC & or Chem ordered:  SHM/ABIM guidelines (see #5) Yes, due to clinical and lab instability   Reference for approximate charges of common labs: CBC auto diff - $76   BMP - $99   Mg - $79    Lines:     Patient Lines/Drains/Airways Status       Active PICC Line / CVC Line / PIV Line / Drain / Airway / Intraosseous Line / Epidural Line / ART Line / Line / Wound / Pressure Ulcer / NG/OG Tube       Name Placement date Placement time Site Days    Peripheral IV 05/10/21 20 G Anterior;Right Forearm 05/10/21  0000  Forearm  2    Peripheral IV 05/10/21 20 G Anterior;Left;Proximal Forearm 05/10/21  1500  Forearm  2    Gastrostomy/Enterostomy Gastrostomy 05/10/21  1900  --  1  Disposition: (Please see PAF column for Expected D/C Date)   Today's date: 05/12/2021   Admit Date: 05/10/2021 12:42 PM   LOS: 2  Clinical Milestones: abx management   Anticipated discharge needs: tbd      Subjective     CC: Pneumomediastinum    Interval History/24 hour events: NAEON    HPI/Subjective:     Patient is feeling well. Pt denies fever, chills, chest pain, SOB, abdominal pain, suprapubic pain and new or worsening LE edema       Review of Systems:     As per HPI    Physical Exam:     VITAL SIGNS PHYSICAL EXAM   Temp:  [97.8 F (36.6 C)-98.3 F (36.8 C)] 97.8 F (36.6 C)  Heart Rate:  [54-73] 61  Resp Rate:  [13-41] 21  BP: (96-133)/(55-71) 106/68  Blood Glucose:    Telemetry:       Intake/Output Summary (Last 24 hours) at 05/12/2021 1504  Last data filed at 05/12/2021 1200  Gross per 24 hour   Intake 1375 ml   Output 560 ml   Net 815 ml    Physical Exam  General: awake, alert X 3  Cardiovascular: regular rate and rhythm, no murmurs, rubs or gallops  Lungs: clear to  auscultation bilaterally, without wheezing, rhonchi, or rales  Abdomen: soft, non-tender, non-distended; no palpable masses,  normoactive bowel sounds  Extremities: no edema  Other: tracheal stoma covered with patch        Meds:     Medications were reviewed:  Current Facility-Administered Medications   Medication Dose Route Frequency    fluconazole  400 mg Intravenous Q24H    [START ON 05/13/2021] levothyroxine  75 mcg Oral Daily at 0600    pantoprazole  40 mg Intravenous BID    piperacillin-tazobactam  4.5 g Intravenous Q8H     Current Facility-Administered Medications   Medication Dose Route Frequency Last Rate     Current Facility-Administered Medications   Medication Dose Route    glucagon (rDNA)  1 mg Intramuscular    And    dextrose  250 mL Intravenous    And    dextrose  25 g Intravenous    And    dextrose  25 g Intravenous         Labs:     Labs (last 72 hours):    Recent Labs   Lab 05/12/21  0034 05/11/21  0123   WBC 7.50 7.94   Hgb 10.6* 10.5*   Hematocrit 33.3* 32.3*   Platelets 167 166       Recent Labs   Lab 05/10/21  1448   PT 13.8*   PT INR 1.2*   PTT 26*    Recent Labs   Lab 05/12/21  0034 05/11/21  0123 05/10/21  1448   Sodium 140 138 139   Potassium 3.7 3.9 4.2   Chloride 109 106 103   CO2 26 24 26    BUN 19.0 23.0 29.0*   Creatinine 0.8 0.9 0.9   Calcium 8.7 8.6 9.4   Albumin  --   --  3.6   Protein, Total  --   --  7.8   Bilirubin, Total  --   --  0.5   Alkaline Phosphatase  --   --  72   ALT  --   --  50   AST (SGOT)  --   --  35   Glucose 48* 82 92  Microbiology, reviewed and are significant for:  Microbiology Results (last 15 days)       Procedure Component Value Units Date/Time    Culture Blood Aerobic and Anaerobic [[id number]] Collected: 05/10/21 1442    Order Status: Completed Specimen: Blood, Venipuncture Updated: 05/11/21 1621    Narrative:      The order will result in two separate 8-30ml bottles  Please do NOT order repeat blood cultures if one has been  drawn within  the last 48 hours  UNLESS concerned for  endocarditis  AVOID BLOOD CULTURE DRAWS FROM CENTRAL LINE IF POSSIBLE  Indications:->Sepsis  ORDER#: [id number]                                    ORDERED BY: LEE, THOMAS  SOURCE: Blood, Venipuncture                          COLLECTED:  05/10/21 14:42  ANTIBIOTICS AT COLL.:                                RECEIVED :  05/10/21 16:19  Culture Blood Aerobic and Anaerobic        PRELIM      05/11/21 16:21  05/11/21   No Growth after 1 day/s of incubation.      Culture Blood Aerobic and Anaerobic [[id number]] Collected: 05/10/21 1442    Order Status: Completed Specimen: Blood, Venipuncture Updated: 05/11/21 1621    Narrative:      The order will result in two separate 8-45ml bottles  Please do NOT order repeat blood cultures if one has been  drawn within the last 48 hours  UNLESS concerned for  endocarditis  AVOID BLOOD CULTURE DRAWS FROM CENTRAL LINE IF POSSIBLE  Indications:->Sepsis  ORDER#: [id number]                                    ORDERED BY: LEE, THOMAS  SOURCE: Blood, Venipuncture                          COLLECTED:  05/10/21 14:42  ANTIBIOTICS AT COLL.:                                RECEIVED :  05/10/21 16:19  Culture Blood Aerobic and Anaerobic        PRELIM      05/11/21 16:21  05/11/21   No Growth after 1 day/s of incubation.      MRSA culture [[id number]] Collected: 05/10/21 1426    Order Status: Completed Specimen: Culturette from Nasal Swab Updated: 05/11/21 1554     Culture MRSA Surveillance Negative for Methicillin Resistant Staph aureus    MRSA culture [[id number]] Collected: 05/10/21 1426    Order Status: Completed Specimen: Culturette from Throat Updated: 05/11/21 1554     Culture MRSA Surveillance Negative for Methicillin Resistant Staph aureus            Imaging, reviewed and are significant for:XR Chest AP Portable    Result Date: 05/10/2021  No acute cardiopulmonary process. Clide Cliff, MD  05/10/2021 3:15 PM         Signed by: Gerilyn Pilgrim  R Loys Hoselton,  DO

## 2021-05-13 ENCOUNTER — Encounter: Payer: Self-pay | Admitting: Critical Care Medicine

## 2021-05-13 ENCOUNTER — Inpatient Hospital Stay: Payer: Medicare Managed Care Other

## 2021-05-13 LAB — CBC
Absolute NRBC: 0 10*3/uL (ref 0.00–0.00)
Hematocrit: 32.8 % — ABNORMAL LOW (ref 37.6–49.6)
Hgb: 10.6 g/dL — ABNORMAL LOW (ref 12.5–17.1)
MCH: 23.5 pg — ABNORMAL LOW (ref 25.1–33.5)
MCHC: 32.3 g/dL (ref 31.5–35.8)
MCV: 72.7 fL — ABNORMAL LOW (ref 78.0–96.0)
MPV: 11.5 fL (ref 8.9–12.5)
Nucleated RBC: 0 /100 WBC (ref 0.0–0.0)
Platelets: 174 10*3/uL (ref 142–346)
RBC: 4.51 10*6/uL (ref 4.20–5.90)
RDW: 16 % — ABNORMAL HIGH (ref 11–15)
WBC: 6.87 10*3/uL (ref 3.10–9.50)

## 2021-05-13 LAB — BASIC METABOLIC PANEL
Anion Gap: 8 (ref 5.0–15.0)
BUN: 14 mg/dL (ref 9.0–28.0)
CO2: 25 mEq/L (ref 17–29)
Calcium: 9.1 mg/dL (ref 7.9–10.2)
Chloride: 108 mEq/L (ref 99–111)
Creatinine: 0.9 mg/dL (ref 0.5–1.5)
Glucose: 95 mg/dL (ref 70–100)
Potassium: 4.2 mEq/L (ref 3.5–5.3)
Sodium: 141 mEq/L (ref 135–145)

## 2021-05-13 LAB — GFR: EGFR: >60

## 2021-05-13 MED ORDER — IOHEXOL 350 MG/ML IV SOLN
70.0000 mL | Freq: Once | INTRAVENOUS | Status: AC | PRN
Start: 2021-05-13 — End: 2021-05-13
  Administered 2021-05-13: 22:00:00 70 mL via INTRAVENOUS

## 2021-05-13 NOTE — Plan of Care (Signed)
Oncology Progress Note    Diagnosis/Chief Complaint: Pneumomediastinum, blood in PEG tube;  Laryngeal CA s/p laryngeal stoma, s/p esophageal dilations x2 (last 04/04/21), s/p PEG  Chemotherapy/Radiation Therapy: s/p XRT  Blood Admin/Hemodynamic Status: no transfusions this shift  Infection/Immune Status: afebrile, abx given as scheduled  IV/Central Line Access: R forearm PIV  Pain: none  Vital Signs: VSS, brady in upper 50s at times  Safety/Fall Preventions: Fall precautions in place  Mobility and ADLs: stand by  Diet/Nutrition: Tube feedings Promote @ 80 q6 H2O  Bowel Movement/Voiding: LBM 10/25 loose per pt  Telemetry: n/a  LDAs: PEG  Language interpreter/Communication: Lary stoma, must press on stoma to talk.  Mostly nods and gestures needs.  Assessment/Plan/Notes: Pt calm and cooperative with care. Tube feeds maintained throughout shift increased to goal rate of 48ml/hr, well tolerated.  Plan for chest CT w/ contrast tonight.      Pt reports loose BM, hat placed in toilet midday and no further BM this shift.       Problem: Safety  Goal: Patient will be free from injury during hospitalization  Outcome: Progressing  Goal: Patient will be free from infection during hospitalization  Outcome: Progressing     Problem: Pain  Goal: Pain at adequate level as identified by patient  Outcome: Progressing     Problem: Side Effects from Pain Analgesia  Goal: Patient will experience minimal side effects of analgesic therapy  Outcome: Progressing     Problem: Discharge Barriers  Goal: Patient will be discharged home or other facility with appropriate resources  Outcome: Progressing     Problem: Psychosocial and Spiritual Needs  Goal: Demonstrates ability to cope with hospitalization/illness  Outcome: Progressing     Problem: Moderate/High Fall Risk Score >5  Goal: Patient will remain free of falls  Outcome: Progressing

## 2021-05-13 NOTE — Plan of Care (Signed)
Oncology Progress Note    Diagnosis: laryngeal CA s/p laryngeal stoma, s/p esophageal dilations x2 (last on 04/04/21), s/p PEG placement, pneumomediastinum   Chief Complaint: blood in PEG tube w/ mid sternal tightness  Chemotherapy/Radiation Therapy: s/p XRT  Blood Admin/Hemodynamic Status: labs pending  Infection/Immune Status: afebrile. Bcx pre-lim 10/22: NGTD  - IV 400mg  diflucan daily  - IV zosyn Q8h  IV/Central Line Access: R FA PIV C/D/I   Pain: Pt denies pain  Vital Signs: Visit Vitals  BP 121/77   Pulse 62   Temp 97.7 F (36.5 C) (Oral)   Resp 17   Ht 1.727 m (5\' 8" )   Wt 57.6 kg (126 lb 14.4 oz)   SpO2 100%   BMI 19.30 kg/m   Safety/Fall Preventions: safety precautions in place + hourly rounding. Pt declined bed alarm  Mobility and ADLs: SBA  Diet/Nutrition: NPO; Promote TF @ goal rate of 44ml/hr w/ H20 flushes Q6h  Bowel Movement/Voiding: LBM: 10/25; voids adequately  LDAs: PEG tube  Language interpreter/Communication: pt presses stoma to speak, voice is weak + hoarse; provided pen and paper to have written form of communication when unable to speak/be understood. English speaking.  Assessment/Plan/Notes: A&Ox4. Pt denies N/V/HA/SOB/CP. Clear/white thin secretions; yaunker suction at bedside. chest CT w/ contrast completed this shift: decreased pneumomediastinum & improved RLL pulmonary infiltrate.    PLAN:  - trach RN to be consult  - monitor counts and transfuse as needed  - cont TF  - cont IV ABX; per ID if pneumomediastinum improves, pt will complete 10 days of empiric antiinfectives w/ augmentin + fluconazole  - f/u w/ chest CT      Problem: Safety  Goal: Patient will be free from injury during hospitalization  Outcome: Progressing  Goal: Patient will be free from infection during hospitalization  Outcome: Progressing     Problem: Pain  Goal: Pain at adequate level as identified by patient  Outcome: Progressing     Problem: Side Effects from Pain Analgesia  Goal: Patient will experience minimal  side effects of analgesic therapy  Outcome: Progressing     Problem: Discharge Barriers  Goal: Patient will be discharged home or other facility with appropriate resources  Outcome: Progressing     Problem: Psychosocial and Spiritual Needs  Goal: Demonstrates ability to cope with hospitalization/illness  Outcome: Progressing     Problem: Moderate/High Fall Risk Score >5  Goal: Patient will remain free of falls  Outcome: Progressing

## 2021-05-13 NOTE — Progress Notes (Signed)
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 Seen in AM. Reports no cough, no fever, no pain, no bleeding. No leukocytosis  Blood cultures remain NGTD    Antimicrobials:   #3 pip-tazo 4.5 gm IV q8 hours  #4 fluconazole 400 mg IV daily     Metronidazole x2 days     Estimated Creatinine Clearance: 61.7 mL/min (based on SCr of 0.9 mg/dL).      Assessment:   70 y/o man with a history of throat cancer (8 years ago) s/p neck resection, radiation, and stoma creation, PEG tube placement  Esophageal strictures  Esophageal balloon dilation, 01/2021 and 04/04/21     He is here with pneumomediastinum detected after recent coughing.  He noted blood in his PEG tube.  He has been evaluated by GI and thoracic surgery and conservative management is planned.  He certainly may have had a small esophageal perforation which has been contained/self limited.  He appears clinically stable.  No fevers, no leukocytosis.  Blood cx 10/22 NGTD.    Discussed with Dr. Cleta Alberts - will plan for repeat CT and if no worsening in pneumomediastinum the patient is likely will be a candidate to complete 10 days total of empiric therapy with Augmentin and fluconazole via G tube    Plan:   -Agree with pip-tazo and fluconazole for now.     -follow up on CT Chest Abdomen - if no new developments and pneumomediastinum is improving will complete 10 days of empiric antiinfectives with Augmentin 875/125 mg bid and fluconazole 400 mg daily via G tube. Patient is not at  risk for MDROs currently    D/w Patient and Dr. Cleta Alberts        Lines:     Patient Lines/Drains/Airways Status       Active PICC Line / CVC Line / PIV Line / Drain / Airway / Intraosseous Line / Epidural Line / ART Line / Line / Wound / Pressure Ulcer / NG/OG Tube       Name Placement date Placement time Site Days    Peripheral IV 05/10/21 20 G Anterior;Right Forearm 05/10/21  0000  Forearm  3    Peripheral IV 05/10/21 20 G Anterior;Left;Proximal Forearm 05/10/21  1500  Forearm  2    Gastrostomy/Enterostomy Gastrostomy 05/10/21  1900  --  2                    *I have performed a risk-benefit analysis and the patient needs a central line for access and IV medications      Review of Systems:   Detailed 12 system review was done; pertinent positives and negatives listed in interval events/subjective of this note, otherwise negative in details.      Physical Exam:     Vitals:    05/13/21 1113   BP: 135/81   Pulse: 61   Resp: 18  Temp: 97.9 F (36.6 C)   SpO2: 100%       General Appearance: non-toxic  Neuro: awake, alert, grossly non-focal  HEENT: no scleral icterus, OP clear  Neck: sunken in appearance, dressing over the stoma, no crepitus  Lungs: decreased bs b/l, no wheezes, rales or rhonchi   Cardiac: normal rate, regular rhythm   Abdomen: soft, benign, PEG in place, no erythema  Extremities: very pedal edema b/l  Skin: no obvious rash  Psych: normal and calm affect          Family History:   History reviewed. No pertinent family history.    Social History:     Social History     Socioeconomic History    Marital status: Unknown     Spouse name: Not on file    Number of children: Not on file    Years of education: Not on file    Highest education level: Not on file   Occupational History    Not on file   Tobacco Use    Smoking status: Former     Types: Cigarettes     Quit date: 05/05/2001     Years since quitting: 20.0    Smokeless tobacco: Never   Vaping Use    Vaping Use: Never used   Substance and Sexual  Activity    Alcohol use: Never    Drug use: Never    Sexual activity: Not Currently   Other Topics Concern    Not on file   Social History Narrative    Not on file     Social Determinants of Health     Financial Resource Strain: Low Risk     Difficulty of Paying Living Expenses: Not very hard   Food Insecurity: Not on file   Transportation Needs: No Transportation Needs    Lack of Transportation (Medical): No    Lack of Transportation (Non-Medical): No   Physical Activity: Not on file   Stress: Not on file   Social Connections: Not on file   Intimate Partner Violence: Not on file   Housing Stability: Low Risk     Unable to Pay for Housing in the Last Year: No    Number of Places Lived in the Last Year: 1    Unstable Housing in the Last Year: No       Allergies:   No Known Allergies    Labs:     Lab Results   Component Value Date    WBC 6.87 05/13/2021    HGB 10.6 (L) 05/13/2021    HCT 32.8 (L) 05/13/2021    MCV 72.7 (L) 05/13/2021    PLT 174 05/13/2021     Lab Results   Component Value Date    CREAT 0.9 05/13/2021     Lab Results   Component Value Date    ALT 50 05/10/2021    AST 35 05/10/2021    ALKPHOS 72 05/10/2021    BILITOTAL 0.5 05/10/2021     No results found for: LACTATE    Microbiology:     Microbiology Results (last 15 days)       Procedure Component Value Units Date/Time    Culture Blood Aerobic and Anaerobic [[id number]] Collected: 05/10/21 1442    Order Status: Completed Specimen: Blood, Venipuncture Updated: 05/12/21 1621    Narrative:      The order will result in two separate 8-92ml bottles  Please do NOT order repeat blood cultures if one has been  drawn  within the last 48 hours  UNLESS concerned for  endocarditis  AVOID BLOOD CULTURE DRAWS FROM CENTRAL LINE IF POSSIBLE  Indications:->Sepsis  ORDER#: [id number]                                    ORDERED BY: LEE, THOMAS  SOURCE: Blood, Venipuncture                          COLLECTED:  05/10/21 14:42  ANTIBIOTICS AT COLL.:                                 RECEIVED :  05/10/21 16:19  Culture Blood Aerobic and Anaerobic        PRELIM      05/12/21 16:21  05/11/21   No Growth after 1 day/s of incubation.  05/12/21   No Growth after 2 day/s of incubation.      Culture Blood Aerobic and Anaerobic [[id number]] Collected: 05/10/21 1442    Order Status: Completed Specimen: Blood, Venipuncture Updated: 05/12/21 1621    Narrative:      The order will result in two separate 8-109ml bottles  Please do NOT order repeat blood cultures if one has been  drawn within the last 48 hours  UNLESS concerned for  endocarditis  AVOID BLOOD CULTURE DRAWS FROM CENTRAL LINE IF POSSIBLE  Indications:->Sepsis  ORDER#: [id number]                                    ORDERED BY: LEE, THOMAS  SOURCE: Blood, Venipuncture                          COLLECTED:  05/10/21 14:42  ANTIBIOTICS AT COLL.:                                RECEIVED :  05/10/21 16:19  Culture Blood Aerobic and Anaerobic        PRELIM      05/12/21 16:21  05/11/21   No Growth after 1 day/s of incubation.  05/12/21   No Growth after 2 day/s of incubation.      MRSA culture [[id number]] Collected: 05/10/21 1426    Order Status: Completed Specimen: Culturette from Nasal Swab Updated: 05/11/21 1554     Culture MRSA Surveillance Negative for Methicillin Resistant Staph aureus    MRSA culture [[id number]] Collected: 05/10/21 1426    Order Status: Completed Specimen: Culturette from Throat Updated: 05/11/21 1554     Culture MRSA Surveillance Negative for Methicillin Resistant Staph aureus            Rads:   No results found.    Signed by: Babs Bertin, MD, MD

## 2021-05-13 NOTE — Progress Notes (Signed)
MEDICINE PROGRESS NOTE    Date Time: 05/13/21 9:22 AM  Patient Name: Charles Peterson  Attending Physician: Tamela Oddi, MD    Assessment:   Charles Peterson is a 70 y.o. 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  He had esophageal balloon dilation twice with the last time in Sept 2022.  He presented to [facility] ER on 05/09/21 after coughing and noting blood from his PEG tube along with mid sternal tightness.  He denied shortness of breath, fatigue, other GI, GU, or neurological complaints.   CT scan of the chest did show pneumomediastinum in the lower chest concerning for esophageal perforation.  Transfer to [facility] was requested for thoracic surgery evaluation.    Patient was seen by both thoracic surgery and gastroenterology with final recommendation from both services to approach with conservative management without further intervention.  The patient is completely n.p.o. at baseline and uses the PEG tube for all intake of nutrition and medication.  The overall synthesis that he likely had a small esophageal tear.      10/25: Patient is feeling well and his tube feeding has been resumed without events.  No recurrence of blood in the PEG tube.  No abdominal pain.  Discussed with his daughter over the phone.  Discussed with infectious disease as well with recommendation to repeat a CT chest.    A/P  # Possible esophageal perforation with pneumomediastinum with small volume bleeding from PEG tube   # Hx throat cancer s/p stoma and PEG  - Evaluated by thoracic surgery - no intervention needed. Recommend GI  -GI consulted to eval for GIB/EGD for perf - no intervention  -Cont PPI  - TF resumed through PEG       # Pneumomediastinum -> discussed with infectious disease today.  Recommend to repeat CT chest ordered.  Continue Zosyn while here with plan to discharge on Augmentin and  fluconazole per the feeding tube.  - Cont. Zosyn   - [place] Metronidazole  - Fluconazole  - ID consulted     # Hypothyroidism  - Synthroid 75 mcg qam (will order IV 37.5 mcg given possible esophageal perf)  Glucose: goal 100 -180     #Hypoglycemia   - hypoglycemic protocol     # HTN  - hold home antihypertensives for now  - Home agents include:  Norvasc 10 mg qd, Hydralazine 50 mg bid, atorvastatin 20 mg qHS  - check 12 lead  - TNI negative at OSH  - BNP 170 at OSH  Blood pressure goal: MAP >65, SBP <160     Pharmacologic ppx: not safe secondary to bleeding from PEG        Physical Exam:     VITAL SIGNS PHYSICAL EXAM   Temp:  [97.3 F (36.3 C)-98.1 F (36.7 C)] 97.3 F (36.3 C)  Heart Rate:  [55-82] 70  Resp Rate:  [12-36] 18  BP: (101-129)/(57-80) 129/80  Blood Glucose:        Intake/Output Summary (Last 24 hours) at 05/13/2021 1610  Last data filed at 05/13/2021 0825  Gross per 24 hour   Intake 1207 ml   Output 450 ml   Net 757 ml    Physical Exam  General: awake, alert X laryngostomy  Cardiovascular: regular rate and rhythm, no murmurs, rubs or gallops  Lungs: clear to auscultation bilaterally, without wheezing, rhonchi, or rales  Abdomen: soft,  non-tender, non-distended; no palpable masses,  normoactive bowel sounds  Extremities: no edema         Meds:     Medications were reviewed:  Current Facility-Administered Medications   Medication Dose Route Frequency    fluconazole  400 mg Intravenous Q24H    levothyroxine  75 mcg Oral Daily at 0600    pantoprazole  40 mg Intravenous BID    piperacillin-tazobactam  4.5 g Intravenous Q8H     Current Facility-Administered Medications   Medication Dose Route Frequency Last Rate     Current Facility-Administered Medications   Medication Dose Route    glucagon (rDNA)  1 mg Intramuscular    And    dextrose  250 mL Intravenous    And    dextrose  25 g Intravenous    And    dextrose  25 g Intravenous         Labs:     Labs (last 72 hours):    Recent Labs   Lab 05/13/21  0333  05/12/21  0034   WBC 6.87 7.50   Hgb 10.6* 10.6*   Hematocrit 32.8* 33.3*   Platelets 174 167       Recent Labs   Lab 05/10/21  1448   PT 13.8*   PT INR 1.2*   PTT 26*    Recent Labs   Lab 05/13/21  0333 05/12/21  0034 05/11/21  0123 05/10/21  1448   Sodium 141 140  More results in Results Review 139   Potassium 4.2 3.7  More results in Results Review 4.2   Chloride 108 109  More results in Results Review 103   CO2 25 26  More results in Results Review 26   BUN 14.0 19.0  More results in Results Review 29.0*   Creatinine 0.9 0.8  More results in Results Review 0.9   Calcium 9.1 8.7  More results in Results Review 9.4   Albumin  --   --   --  3.6   Protein, Total  --   --   --  7.8   Bilirubin, Total  --   --   --  0.5   Alkaline Phosphatase  --   --   --  72   ALT  --   --   --  50   AST (SGOT)  --   --   --  35   Glucose 95 48*  More results in Results Review 92   More results in Results Review = values in this interval not displayed.                   Microbiology, reviewed and are significant for:  Microbiology Results (last 15 days)       Procedure Component Value Units Date/Time    Culture Blood Aerobic and Anaerobic [[id number]] Collected: 05/10/21 1442    Order Status: Completed Specimen: Blood, Venipuncture Updated: 05/12/21 1621    Narrative:      The order will result in two separate 8-30ml bottles  Please do NOT order repeat blood cultures if one has been  drawn within the last 48 hours  UNLESS concerned for  endocarditis  AVOID BLOOD CULTURE DRAWS FROM CENTRAL LINE IF POSSIBLE  Indications:->Sepsis  ORDER#: [id number]                                    ORDERED BY:  LEE, THOMAS  SOURCE: Blood, Venipuncture                          COLLECTED:  05/10/21 14:42  ANTIBIOTICS AT COLL.:                                RECEIVED :  05/10/21 16:19  Culture Blood Aerobic and Anaerobic        PRELIM      05/12/21 16:21  05/11/21   No Growth after 1 day/s of incubation.  05/12/21   No Growth after 2 day/s of incubation.       Culture Blood Aerobic and Anaerobic [[id number]] Collected: 05/10/21 1442    Order Status: Completed Specimen: Blood, Venipuncture Updated: 05/12/21 1621    Narrative:      The order will result in two separate 8-43ml bottles  Please do NOT order repeat blood cultures if one has been  drawn within the last 48 hours  UNLESS concerned for  endocarditis  AVOID BLOOD CULTURE DRAWS FROM CENTRAL LINE IF POSSIBLE  Indications:->Sepsis  ORDER#: [id number]                                    ORDERED BY: LEE, THOMAS  SOURCE: Blood, Venipuncture                          COLLECTED:  05/10/21 14:42  ANTIBIOTICS AT COLL.:                                RECEIVED :  05/10/21 16:19  Culture Blood Aerobic and Anaerobic        PRELIM      05/12/21 16:21  05/11/21   No Growth after 1 day/s of incubation.  05/12/21   No Growth after 2 day/s of incubation.      MRSA culture [[id number]] Collected: 05/10/21 1426    Order Status: Completed Specimen: Culturette from Nasal Swab Updated: 05/11/21 1554     Culture MRSA Surveillance Negative for Methicillin Resistant Staph aureus    MRSA culture [[id number]] Collected: 05/10/21 1426    Order Status: Completed Specimen: Culturette from Throat Updated: 05/11/21 1554     Culture MRSA Surveillance Negative for Methicillin Resistant Staph aureus          Signed by: Tamela Oddi, MD

## 2021-05-13 NOTE — Plan of Care (Addendum)
Oncology Progress Note    Diagnosis: laryngeal CA s/p laryngeal stoma, s/p esophageal dilations x2 (last on 04/04/21), s/p PEG placement, pneumomediastinum   Chief Complaint: blood in PEG tube w/ mid sternal tightness  Chemotherapy/Radiation Therapy: s/p XRT  Blood Admin/Hemodynamic Status: counts stable no transfusion given this shift   Recent Labs   Lab 05/13/21  0333   WBC 6.87   Hgb 10.6*   Hematocrit 32.8*   Platelets 174     Recent Labs   Lab 05/13/21  0333   Sodium 141   Potassium 4.2   Chloride 108   CO2 25   BUN 14.0   Creatinine 0.9   EGFR >60.0   Glucose 95   Calcium 9.1   Infection/Immune Status: afebrile. Bcx pre-lim 10/22: NGTD  - IV 400mg  diflucan daily  - IV zosyn Q8h  IV/Central Line Access:   - L FA 20G C/D/I SL due 10/29  - R FA 20G C/D/I SL due 10/29  Pain: pt denies pain  Vital Signs: Visit Vitals  BP 113/72   Pulse (!) 59   Temp 98.1 F (36.7 C) (Oral)   Resp 16   Ht 1.727 m (5\' 8" )   Wt 59.9 kg (132 lb)   SpO2 100%   BMI 20.07 kg/m   Safety/Fall Preventions: safety precautions in place + hourly rounding. Pt declined bed alarm  Mobility and ADLs: independent  Diet/Nutrition: NPO: promote TF @ 69ml/hr w/ H20 flush Q6 + ensure clear 2x daily for breakfast & dinner via Gtube  Bowel Movement/Voiding: LBM: 10/25; Voids adequately  LDAs: G tube: drsng changed this shift  Language: pt presses stoma to speak, voice is weak + hoarse; provided pen and paper to have written form of communication when unable to speak/be understood. English speaking.  Assessment/Plan/Notes: A&Ox4. Lungs clear bilaterally. Pt denies N/V/HA/SOB/CP. Clear/white thin secretions; yaunker suction at bedside. TF restarted ~0330.    2 RN skin check w/ Karie Mainland, RN: bilateral heel cracking. Laceration @ G tube & stoma site noted; otherwise skin intact.     - trach RN consult  - monitor counts and transfuse as needed  - cont TF  - cont IV ABX  - possible EGD?    Problem: Safety  Goal: Patient will be free from injury during  hospitalization  Outcome: Progressing  Goal: Patient will be free from infection during hospitalization  Outcome: Progressing     Problem: Pain  Goal: Pain at adequate level as identified by patient  Outcome: Progressing     Problem: Side Effects from Pain Analgesia  Goal: Patient will experience minimal side effects of analgesic therapy  Outcome: Progressing     Problem: Discharge Barriers  Goal: Patient will be discharged home or other facility with appropriate resources  Outcome: Progressing     Problem: Psychosocial and Spiritual Needs  Goal: Demonstrates ability to cope with hospitalization/illness  Outcome: Progressing     Problem: Moderate/High Fall Risk Score >5  Goal: Patient will remain free of falls  Outcome: Progressing

## 2021-05-14 DIAGNOSIS — K219 Gastro-esophageal reflux disease without esophagitis: Secondary | ICD-10-CM

## 2021-05-14 DIAGNOSIS — Z9002 Acquired absence of larynx: Secondary | ICD-10-CM

## 2021-05-14 LAB — CBC
Absolute NRBC: 0 10*3/uL (ref 0.00–0.00)
Hematocrit: 34.8 % — ABNORMAL LOW (ref 37.6–49.6)
Hgb: 11.2 g/dL — ABNORMAL LOW (ref 12.5–17.1)
MCH: 23.5 pg — ABNORMAL LOW (ref 25.1–33.5)
MCHC: 32.2 g/dL (ref 31.5–35.8)
MCV: 73 fL — ABNORMAL LOW (ref 78.0–96.0)
MPV: 11.4 fL (ref 8.9–12.5)
Nucleated RBC: 0 /100 WBC (ref 0.0–0.0)
Platelets: 215 10*3/uL (ref 142–346)
RBC: 4.77 10*6/uL (ref 4.20–5.90)
RDW: 16 % — ABNORMAL HIGH (ref 11–15)
WBC: 7.93 10*3/uL (ref 3.10–9.50)

## 2021-05-14 MED ORDER — AMOXICILLIN-POT CLAVULANATE 875-125 MG PO TABS
1.0000 | ORAL_TABLET | Freq: Two times a day (BID) | ORAL | 0 refills | Status: AC
Start: 2021-05-14 — End: 2021-05-24

## 2021-05-14 MED ORDER — SODIUM BICARBONATE 8.4 % IV SOLN
40.0000 mg | DELAYED_RELEASE_TABLET | Freq: Every day | ORAL | 0 refills | Status: DC
Start: 2021-05-14 — End: 2021-05-14

## 2021-05-14 MED ORDER — FLUCONAZOLE 200 MG PO TABS
400.0000 mg | ORAL_TABLET | Freq: Every day | ORAL | 0 refills | Status: AC
Start: 2021-05-14 — End: 2021-05-24

## 2021-05-14 MED ORDER — FLUCONAZOLE 40 MG/ML PO SUSR
400.0000 mg | Freq: Every day | ORAL | 0 refills | Status: DC
Start: 2021-05-14 — End: 2021-05-14

## 2021-05-14 NOTE — Progress Notes (Signed)
Mr. Decoteau discharged home with daughter via POV. Follow-up appointments scheduled. Refused home health with Skilled Nursing. RN caring for patient provided daughter with teaching to manage maintenance of PEG tube. Patient has infusion services with a company in [place] according to daughter. Daughter states that her father was independent with care of management with PEG tube and tube feeding with flushes prior to admission. Home Health RN Liaison has started order for gomko suction machine with yankaur suction. DME will be delivered to home. Follow up appointments are scheduled for [facility] and Outpatient SLP. PCP requiring patient to call to schedule follow up. No further needs noted at this time.       05/14/21 1709   Discharge Disposition   Patient preference/choice provided? Yes   Physical Discharge Disposition Home   Name of DME Agency   (Yankaur suction with gomko suction machine ordered)   Mode of Engineer, water   Patient/Family/POA notified of transfer plan Yes   Patient agreeable to discharge plan/expected d/c date? Yes   Family/POA agreeable to discharge plan/expected d/c date? Yes   Bedside nurse notified of transport plan? Yes   Outpatient Services   Home Health   (Refused)   CM Interventions   Follow up appointment scheduled? Yes   Is this a Medicare focused or COVID patient? No   Is this appointment within 48-72 hours? No   Multidisciplinary rounds/family meeting before d/c? Yes   Medicare Checklist   Is this a Medicare patient? Yes   Patient received 1st IMM Letter? Yes   3 midnight inpatient qualifying stay (SNF only) Yes   If LOS 3 days or greater, did patient received 2nd IMM Letter? Yes     Samella Parr  RN Case Manager  [facility]  [phone]  [email or web address]

## 2021-05-14 NOTE — Progress Notes (Signed)
05/14/21 @ 1200 spoke with patient at bedside to [facility] care services. Patient, declines SN-visit for G-tube. I can not send a Nurse for Medication management due to the fact patient is not on any Anti-coagulants or diabetic medication. CM/Kathy made aware

## 2021-05-14 NOTE — Progress Notes (Signed)
I have reached out to Daughter [place] on the phone. Discussed discharge. Shawntelle informed me that Mr. Dripps has been independent with PEG tube care and tubefeedings and his stoma care management. He receives his supplies from a company in [place], [place]. I have discussed how some of his medications have now changed.     I have reached out to [facility] RN Liaison. Requesting Home Health Skilled Nursing for Cardiopulmonary Assessment and Medication Management with medication changes.    Mr. Mendiola sees Courtney Paris MD Gastroenterology [facility] and has appointment on November 4th. He also works with Rosaria Ferries SLP at [facility] and has appointment the same day. I have updated team. I will continue discharge planning coordination for this afternoon.    Samella Parr  RN Case Manager  [facility]  [phone]  [email or web address]

## 2021-05-14 NOTE — Progress Notes (Signed)
Home Health face-to-face (FTF) Encounter (Order [id number])  Consult  Date: 05/14/2021 Department: Sophronia Simas [place] [address] Ordering/Authorizing: Tamela Oddi, MD     Order Information    Order Date/Time Release Date/Time Start Date/Time End Date/Time   05/14/21 03:46 PM None 05/14/21 03:42 PM 05/14/21 03:42 PM     Order Details    Frequency Duration Priority Order Class   Once 1  occurrence Routine Hospital Performed     Standing Order Information    Remaining Occurrences Interval Last Released     0/1 Once 05/14/2021              Provider Information    Ordering User Ordering Provider Authorizing Provider   Beckey Downing, RN Borgschulte, [place], MD Tamela Oddi, MD   Attending Provider(s) Admitting Provider PCP   Lianne Moris, MD; Velia Meyer, MD; Tamela Oddi, MD; Lexine Baton, MD Janice Coffin, MD     Verbal Order Info    Action Created on Order Mode Entered by Responsible Provider Signed by Signed on   Ordering 05/14/21 1546 Telephone with Shea Evans, RN Tamela Oddi, MD             Comments    Suctioning machine, canister and Yankaur  needed >99 months, NPI: _[id number]   Dx: Pneumomediastinum J98.2                Home Health face-to-face (FTF) Encounter: Patient Communication     Not Released  Not seen         Order Questions    Question Answer Comment   Date I saw the patient face-to-face: 05/14/2021    Evidence this patient is homebound because: O.  N/A DME only    Medical conditions that necessitate Home Health care: M.  N/A DME only. No skilled services needed    Per clinical findings, following services are medically necessary: DME    DME Other (add in comment) Suctioning machine with canister and Yankaur   Other (please specify) see comments                     Process Instructions    Please select Home Care Services medically necessary.     Based on the above findings, I certify that this patient is confined to the  home and needs intermittent skilled nursing care, physical therapry and / or speech therapy or continues to need occupational therapy. The patient is under my care, and I have initiated the establishment of the plan of care. This patient will be followed by a physician who will periodically review the plan of care.      Collection Information            Consult Order Info    ID Description Priority Start Date Start Time   [id number] Home Health face-to-face (FTF) Encounter Routine 05/14/2021  3:42 PM   Provider Specialty Referred to   ______________________________________ _____________________________________                         Verbal Order Info    Action Created on Order Mode Entered by Responsible Provider Signed by Signed on   Ordering 05/14/21 1546 Telephone with Shea Evans, RN Tamela Oddi, MD             Patient Information    Patient Name   Charles Peterson Legal Sex   Male DOB  05-16-1951       Reprint Order Requisition    Home Health face-to-face (FTF) Encounter (Order [id number]) on 05/14/21       Additional Information    Associated Reports External References   Priority and Order Details [employer]

## 2021-05-14 NOTE — Discharge Summary (Signed)
Hospitalist DISCHARGE SUMMARY  [facility]   Department of Medicine  24/7 on-call pager: [phone] (8-HOSP)   Main Office: [phone]   Main Fax: [phone]                Date of Admission: 05/10/2021  Date of Discharge: 05/14/2021        Date Time: 05/14/21 7:54 PM  Patient Name: Charles Peterson  Attending Physician: No att. providers found  Primary Care Provider: Janice Coffin, MD        Discharge Diagnoses:    Principal Diagnosis: Small esophageal tear            Hospital Course        Reason for Admission/HPI:    Small esophageal tear    Hospital Course:      Charles Peterson is a 70 y.o. 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  He had esophageal balloon dilation twice with the last time in Sept 2022.  He presented to [facility] ER on 05/09/21 after coughing and noting blood from his PEG tube along with mid sternal tightness.  He denied shortness of breath, fatigue, other GI, GU, or neurological complaints.   CT scan of the chest did show pneumomediastinum in the lower chest concerning for esophageal perforation.  Transfer to [facility] was requested for thoracic surgery evaluation.     Patient was seen by both thoracic surgery and gastroenterology with final recommendation from both services to approach with conservative management without further intervention.  The patient is completely n.p.o. at baseline and uses the PEG tube for all intake of nutrition and medication.  The overall impression was that he likely had a small esophageal tear possibly related to cough and/or the recent dilatation in Sep. He is completely NPO and uses the PEG for meds and all fluid and nutrition.  ID consulted as well by the time of Tobias he was tolerating the TF and had no pain. No fevers. A repeat CT chest showed improved findings. ID recc to [place] to home on 10 daus of  Augmentin and Diflucan. He will [place] to home to f.u with PCP and [facility].       Dx:  # Possible esophageal perforation with pneumomediastinum with small volume bleeding from PEG tube   # Hx throat cancer s/p stoma and PEG  # Pneumomediastinum  # Hypothyroidism  #Hypoglycemia   # HTN- resolved. Did not req bp meds here.   - hold home antihypertensives for now                            Discharge Day Exam:  Temp:  [97.5 F (36.4 C)-97.9 F (36.6 C)] 97.9 F (36.6 C)  Heart Rate:  [53-62] 61  Resp Rate:  [17-18] 18  BP: (108-122)/(69-79) 109/69  General appearance: alert and oriented x3, no acute distress  HEENT: normocephalic atraumatic, PERRLA, EOMI, moist mucous membranes   Neck:  supple  Lungs:  clear to auscultation bilaterally, no wheezes, rubs, or rhonchi  Heart:   normal S1, S2, no murmurs rubs or gallops  Abdomen:  soft, nontender, nondistended, no rebound or guarding  Back:  no spinal tenderness  Extremities: no clubbing, cyanosis, or edema   Skin:  no rash  Neurologic:  normal mental status, CNs II-XII intact, 5/5 strength throughout, normal sensation  Consultations:            RESULTS        Recent Lab Results:    Results       Procedure Component Value Units Date/Time    Culture Blood Aerobic and Anaerobic [[id number]] Collected: 05/10/21 1442    Specimen: Blood, Venipuncture Updated: 05/14/21 1621    Narrative:      The order will result in two separate 8-20ml bottles  Please do NOT order repeat blood cultures if one has been  drawn within the last 48 hours  UNLESS concerned for  endocarditis  AVOID BLOOD CULTURE DRAWS FROM CENTRAL LINE IF POSSIBLE  Indications:->Sepsis  ORDER#: [id number]                                    ORDERED BY: LEE, THOMAS  SOURCE: Blood, Venipuncture                          COLLECTED:  05/10/21 14:42  ANTIBIOTICS AT COLL.:                                RECEIVED :  05/10/21 16:19  Culture Blood Aerobic and Anaerobic        PRELIM      05/14/21 16:21  05/11/21   No Growth  after 1 day/s of incubation.  05/12/21   No Growth after 2 day/s of incubation.  05/13/21   No Growth after 3 day/s of incubation.  05/14/21   No Growth after 4 day/s of incubation.      Culture Blood Aerobic and Anaerobic [[id number]] Collected: 05/10/21 1442    Specimen: Blood, Venipuncture Updated: 05/14/21 1621    Narrative:      The order will result in two separate 8-65ml bottles  Please do NOT order repeat blood cultures if one has been  drawn within the last 48 hours  UNLESS concerned for  endocarditis  AVOID BLOOD CULTURE DRAWS FROM CENTRAL LINE IF POSSIBLE  Indications:->Sepsis  ORDER#: [id number]                                    ORDERED BY: LEE, THOMAS  SOURCE: Blood, Venipuncture                          COLLECTED:  05/10/21 14:42  ANTIBIOTICS AT COLL.:                                RECEIVED :  05/10/21 16:19  Culture Blood Aerobic and Anaerobic        PRELIM      05/14/21 16:21  05/11/21   No Growth after 1 day/s of incubation.  05/12/21   No Growth after 2 day/s of incubation.  05/13/21   No Growth after 3 day/s of incubation.  05/14/21   No Growth after 4 day/s of incubation.      CBC without differential [[id number]]  (Abnormal) Collected: 05/14/21 0447    Specimen: Blood Updated: 05/14/21 0536     WBC 7.93 x10 3/uL      Hgb 11.2 g/dL      Hematocrit 54.2 %  Platelets 215 x10 3/uL      RBC 4.77 x10 6/uL      MCV 73.0 fL      MCH 23.5 pg      MCHC 32.2 g/dL      RDW 16 %      MPV 11.4 fL      Nucleated RBC 0.0 /100 WBC      Absolute NRBC 0.00 x10 3/uL     Basic Metabolic Panel [[id number]] Collected: 05/13/21 0333    Specimen: Blood Updated: 05/13/21 0431     Glucose 95 mg/dL      BUN 40.9 mg/dL      Creatinine 0.9 mg/dL      Calcium 9.1 mg/dL      Sodium 811 mEq/L      Potassium 4.2 mEq/L      Chloride 108 mEq/L      CO2 25 mEq/L      Anion Gap 8.0    GFR [[id number]] Collected: 05/13/21 0333     Updated: 05/13/21 0431     EGFR >60.0       CBC without differential [[id number]]  (Abnormal) Collected:  05/13/21 0333    Specimen: Blood Updated: 05/13/21 0419     WBC 6.87 x10 3/uL      Hgb 10.6 g/dL      Hematocrit 46.9 %      Platelets 174 x10 3/uL      RBC 4.51 x10 6/uL      MCV 72.7 fL      MCH 23.5 pg      MCHC 32.3 g/dL      RDW 16 %      MPV 11.5 fL      Nucleated RBC 0.0 /100 WBC      Absolute NRBC 0.00 x10 3/uL     Glucose Whole Blood - POCT [[id number]]  (Abnormal) Collected: 05/12/21 0305     Updated: 05/12/21 0309     Whole Blood Glucose POCT 195 mg/dL     Glucose Whole Blood - POCT [[id number]]  (Abnormal) Collected: 05/12/21 0218     Updated: 05/12/21 0220     Whole Blood Glucose POCT 262 mg/dL     Glucose Whole Blood - POCT [[id number]]  (Abnormal) Collected: 05/12/21 0113     Updated: 05/12/21 0114     Whole Blood Glucose POCT 53 mg/dL     Basic Metabolic Panel [[id number]]  (Abnormal) Collected: 05/12/21 0034    Specimen: Blood Updated: 05/12/21 0110     Glucose 48 mg/dL      BUN 75.6 mg/dL      Creatinine 0.8 mg/dL      Calcium 8.7 mg/dL      Sodium 433 mEq/L      Potassium 3.7 mEq/L      Chloride 109 mEq/L      CO2 26 mEq/L      Anion Gap 5.0    GFR [[id number]] Collected: 05/12/21 0034     Updated: 05/12/21 0110     EGFR >60.0       CBC without differential [[id number]]  (Abnormal) Collected: 05/12/21 0034    Specimen: Blood Updated: 05/12/21 0053     WBC 7.50 x10 3/uL      Hgb 10.6 g/dL      Hematocrit 09.3 %      Platelets 167 x10 3/uL      RBC 4.43 x10 6/uL      MCV 75.2 fL      MCH 23.9 pg  MCHC 31.8 g/dL      RDW 16 %      MPV 11.5 fL      Nucleated RBC 0.0 /100 WBC      Absolute NRBC 0.00 x10 3/uL     MRSA culture [[id number]] Collected: 05/10/21 1426    Specimen: Culturette from Nasal Swab Updated: 05/11/21 1554     Culture MRSA Surveillance Negative for Methicillin Resistant Staph aureus    MRSA culture [[id number]] Collected: 05/10/21 1426    Specimen: Culturette from Throat Updated: 05/11/21 1554     Culture MRSA Surveillance Negative for Methicillin Resistant Staph aureus    Basic Metabolic  Panel [[id number]] Collected: 05/11/21 0123    Specimen: Blood Updated: 05/11/21 0206     Glucose 82 mg/dL      BUN 44.0 mg/dL      Creatinine 0.9 mg/dL      Calcium 8.6 mg/dL      Sodium 102 mEq/L      Potassium 3.9 mEq/L      Chloride 106 mEq/L      CO2 24 mEq/L      Anion Gap 8.0    GFR [[id number]] Collected: 05/11/21 0123     Updated: 05/11/21 0206     EGFR >60.0       CBC without differential [[id number]]  (Abnormal) Collected: 05/11/21 0123    Specimen: Blood Updated: 05/11/21 0150     WBC 7.94 x10 3/uL      Hgb 10.5 g/dL      Hematocrit 38.7 %      Platelets 166 x10 3/uL      RBC 4.44 x10 6/uL      MCV 72.7 fL      MCH 23.6 pg      MCHC 32.5 g/dL      RDW 15 %      MPV 11.8 fL      Nucleated RBC 0.0 /100 WBC      Absolute NRBC 0.00 x10 3/uL               Radiology Results:    CT Chest With Contrast    Result Date: 05/13/2021   1. Decreased pneumomediastinum 2. Improved right lower lobe pulmonary infiltrate J. Carole Binning, MD  05/13/2021 10:00 PM    XR Chest AP Portable    Result Date: 05/10/2021  No acute cardiopulmonary process. Clide Cliff, MD  05/10/2021 3:15 PM             DISCHARGE MEDICATIONS:       Discharge Medication List        Taking      amoxicillin-clavulanate 875-125 MG per tablet  Dose: 1 tablet  Commonly known as: AUGMENTIN  Take 1 tablet by mouth 2 (two) times daily for 10 days Crush the tablet and dispense with water flush in the feeding tube.     fluconazole 200 MG tablet  Dose: 400 mg  Commonly known as: DIFLUCAN  2 tablets (400 mg total) by per G tube route daily for 10 days Crush and give per the feeding tube.     levothyroxine 88 MCG tablet  Dose: 88 mcg  Commonly known as: SYNTHROID  Take 88 mcg by mouth Once a day at 6:00am            STOP taking these medications      amLODIPine-atorvastatin 10-10 MG per tablet  Commonly known as: CADUET     hydrALAZINE 50 MG tablet  Commonly known as: APRESOLINE  Allergies:    No Known Allergies          DISCHARGE INSTRUCTIONS         Disposition:  Home with family    Diet:TF      Complete instructions and follow up are in the patient's After Visit Summary    Follow-up Plan:     Follow-up Information       Janice Coffin, MD. Go on 05/30/2021.    Specialty: Family Medicine  Why: Appt is set for 11/11 at 4PM With Dr. Danne Baxter information:  [address]  [address]  [place] [address]  [phone]               [facility] Follow up.    Why: As directed/scheduled.                                   Immunizations Provided:  Immunization History   Administered Date(s) Administered    COVID-19 mRNA vaccine 12 years and above (Moderna) 100 mcg/0.5 mL 10/19/2019, 11/16/2019, 06/05/2020         Minutes spent coordinating discharge and reviewing discharge plan:45 minutes    Signed by: Tamela Oddi, MD, MD    Communication with PCP:   [facility] PCP-EPIC Inbasket message sent to update    CC: Janice Coffin, MD

## 2021-05-14 NOTE — Plan of Care (Signed)
Problem: Safety  Goal: Patient will be free from injury during hospitalization  05/14/2021 1613 by Lucius Conn, RN  Outcome: Adequate for Discharge  05/14/2021 0830 by Lucius Conn, RN  Outcome: Progressing  Goal: Patient will be free from infection during hospitalization  05/14/2021 1613 by Lucius Conn, RN  Outcome: Adequate for Discharge  05/14/2021 0830 by Lucius Conn, RN  Outcome: Progressing     Problem: Pain  Goal: Pain at adequate level as identified by patient  05/14/2021 1613 by Lucius Conn, RN  Outcome: Adequate for Discharge  05/14/2021 0830 by Lucius Conn, RN  Outcome: Progressing     Problem: Side Effects from Pain Analgesia  Goal: Patient will experience minimal side effects of analgesic therapy  05/14/2021 1613 by Lucius Conn, RN  Outcome: Adequate for Discharge  05/14/2021 0830 by Lucius Conn, RN  Outcome: Progressing     Problem: Discharge Barriers  Goal: Patient will be discharged home or other facility with appropriate resources  05/14/2021 1613 by Lucius Conn, RN  Outcome: Adequate for Discharge  05/14/2021 0830 by Lucius Conn, RN  Outcome: Progressing     Problem: Psychosocial and Spiritual Needs  Goal: Demonstrates ability to cope with hospitalization/illness  05/14/2021 1613 by Lucius Conn, RN  Outcome: Adequate for Discharge  05/14/2021 0830 by Lucius Conn, RN  Outcome: Progressing     Problem: Moderate/High Fall Risk Score >5  Goal: Patient will remain free of falls  05/14/2021 1613 by Lucius Conn, RN  Outcome: Adequate for Discharge  05/14/2021 0830 by Lucius Conn, RN  Outcome: Progressing

## 2021-05-14 NOTE — Discharge Instr - AVS First Page (Addendum)
Reason for your Hospital Admission:  -Concern for a small tear in the esophagus -> you was transferred here and you were seen by GI and Thoracic surgery. All felt that you did not need surgery or procedures as the tear appeared to have closed off on its own.  You were also seen by Infectious Disease and recommended to go home with a 10 day course of antibiotics.     Instructions for after your discharge:  - Follow up with your PCP and your cancer specialists at [facility].   - Take 10 days of antibiotics Augmentin 875/125 mg twice a day and fluconazole 400 mg daily via G tube.

## 2021-05-14 NOTE — Progress Notes (Addendum)
Office: [phone]  Epic GroupChat: "[facility])"    Date Time: 05/14/21 @NOW   Patient Name: Charles Peterson, Charles Peterson      Problem List:    Acute Problem List:   Throat cancer (8 years ago) s/p neck resection, radiation, and stoma creation, PEG tube placement  Esophageal strictures  Esophageal balloon dilation, 01/2021 and 04/04/21     Pneumomediastinum  -Blood in the PEG tube   -CT at the OSH      ID History:      Chronic Conditions:  HTN  GERD  Hypothyroidism  NKDA      Interval Events/Subjective:   05/14/21. Seen in AM. Reports no cough, no fever, no pain, no bleeding. WBC 7.9K  Blood cultures remain NGTD. CT chest reviewed - resolving pneumomediastinum, improving patchy infiltrate right medial lung base    Antimicrobials:   #4 pip-tazo 4.5 gm IV q8 hours  #5 fluconazole 400 mg IV daily     Metronidazole x2 days     Estimated Creatinine Clearance: 62.2 mL/min (based on SCr of 0.9 mg/dL).      Assessment:   #Pneumomediastinum  #Possible aspiration pneumonia  70 y/o man with a history of throat cancer (8 years ago) s/p neck resection, radiation, and stoma creation, PEG tube placement  Esophageal strictures  Esophageal balloon dilation, 01/2021 and 04/04/21     He is here with pneumomediastinum detected after recent coughing.  He noted blood in his PEG tube.  He has been evaluated by GI and thoracic surgery and conservative management is planned.  He certainly may have had a small esophageal perforation which has been contained/self limited.  He appears clinically stable.  No fevers, no leukocytosis.  Blood cx 10/22 NGTD.    Repeat CT 10/25 with improving pneumomediastinum and infiltrate, no fever, no leukocytosis    Plan:   -can complete 10 days of empiric antiinfectives with Augmentin 875/125 mg bid and fluconazole 400 mg daily via G tube Patient is not at risk for MDROs currently    Needs to seek medical help if any worsening after the discharge    D/w the Patient and Dr.  Cleta Alberts        Lines:     Patient Lines/Drains/Airways Status       Active PICC Line / CVC Line / PIV Line / Drain / Airway / Intraosseous Line / Epidural Line / ART Line / Line / Wound / Pressure Ulcer / NG/OG Tube       Name Placement date Placement time Site Days    Peripheral IV 05/10/21 20 G Anterior;Right Forearm 05/10/21  0000  Forearm  3    Peripheral IV 05/10/21 20 G Anterior;Left;Proximal Forearm 05/10/21  1500  Forearm  2    Gastrostomy/Enterostomy Gastrostomy 05/10/21  1900  --  2                    *I have performed a risk-benefit analysis and the patient needs a central line for access and IV medications      Review of Systems:   Detailed 12 system review was done; pertinent positives and negatives listed in interval events/subjective of this note, otherwise negative in details.      Physical Exam:     Vitals:    05/14/21 0713   BP: 122/79   Pulse: 60   Resp: 18   Temp: 97.7 F (36.5 C)   SpO2: 100%       General Appearance: non-toxic, on  room air  Neuro: awake, alert, grossly non-focal  HEENT: no scleral icterus, OP clear  Neck: sunken in appearance, dressing over the stoma, no crepitus  Lungs: decreased bs b/l, no wheezes, rales or rhonchi   Cardiac: normal rate, regular rhythm   Abdomen: soft, benign, PEG in place, no erythema  Extremities: very pedal edema b/l  Skin: no obvious rash  Psych: normal and calm affect          Family History:   History reviewed. No pertinent family history.    Social History:     Social History     Socioeconomic History    Marital status: Unknown     Spouse name: Not on file    Number of children: Not on file    Years of education: Not on file    Highest education level: Not on file   Occupational History    Not on file   Tobacco Use    Smoking status: Former     Types: Cigarettes     Quit date: 05/05/2001     Years since quitting: 20.0    Smokeless tobacco: Never   Vaping Use    Vaping Use: Never used   Substance and Sexual Activity    Alcohol use: Never    Drug  use: Never    Sexual activity: Not Currently   Other Topics Concern    Not on file   Social History Narrative    Not on file     Social Determinants of Health     Financial Resource Strain: Low Risk     Difficulty of Paying Living Expenses: Not very hard   Food Insecurity: Not on file   Transportation Needs: No Transportation Needs    Lack of Transportation (Medical): No    Lack of Transportation (Non-Medical): No   Physical Activity: Not on file   Stress: Not on file   Social Connections: Not on file   Intimate Partner Violence: Not on file   Housing Stability: Low Risk     Unable to Pay for Housing in the Last Year: No    Number of Places Lived in the Last Year: 1    Unstable Housing in the Last Year: No       Allergies:   No Known Allergies    Labs:     Lab Results   Component Value Date    WBC 7.93 05/14/2021    HGB 11.2 (L) 05/14/2021    HCT 34.8 (L) 05/14/2021    MCV 73.0 (L) 05/14/2021    PLT 215 05/14/2021     Lab Results   Component Value Date    CREAT 0.9 05/13/2021     Lab Results   Component Value Date    ALT 50 05/10/2021    AST 35 05/10/2021    ALKPHOS 72 05/10/2021    BILITOTAL 0.5 05/10/2021     No results found for: LACTATE    Microbiology:     Microbiology Results (last 15 days)       Procedure Component Value Units Date/Time    Culture Blood Aerobic and Anaerobic [[id number]] Collected: 05/10/21 1442    Order Status: Completed Specimen: Blood, Venipuncture Updated: 05/13/21 1621    Narrative:      The order will result in two separate 8-56ml bottles  Please do NOT order repeat blood cultures if one has been  drawn within the last 48 hours  UNLESS concerned for  endocarditis  AVOID BLOOD CULTURE DRAWS  FROM CENTRAL LINE IF POSSIBLE  Indications:->Sepsis  ORDER#: [id number]                                    ORDERED BY: LEE, THOMAS  SOURCE: Blood, Venipuncture                          COLLECTED:  05/10/21 14:42  ANTIBIOTICS AT COLL.:                                RECEIVED :  05/10/21 16:19  Culture  Blood Aerobic and Anaerobic        PRELIM      05/13/21 16:21  05/11/21   No Growth after 1 day/s of incubation.  05/12/21   No Growth after 2 day/s of incubation.  05/13/21   No Growth after 3 day/s of incubation.      Culture Blood Aerobic and Anaerobic [[id number]] Collected: 05/10/21 1442    Order Status: Completed Specimen: Blood, Venipuncture Updated: 05/13/21 1621    Narrative:      The order will result in two separate 8-58ml bottles  Please do NOT order repeat blood cultures if one has been  drawn within the last 48 hours  UNLESS concerned for  endocarditis  AVOID BLOOD CULTURE DRAWS FROM CENTRAL LINE IF POSSIBLE  Indications:->Sepsis  ORDER#: [id number]                                    ORDERED BY: LEE, THOMAS  SOURCE: Blood, Venipuncture                          COLLECTED:  05/10/21 14:42  ANTIBIOTICS AT COLL.:                                RECEIVED :  05/10/21 16:19  Culture Blood Aerobic and Anaerobic        PRELIM      05/13/21 16:21  05/11/21   No Growth after 1 day/s of incubation.  05/12/21   No Growth after 2 day/s of incubation.  05/13/21   No Growth after 3 day/s of incubation.      MRSA culture [[id number]] Collected: 05/10/21 1426    Order Status: Completed Specimen: Culturette from Nasal Swab Updated: 05/11/21 1554     Culture MRSA Surveillance Negative for Methicillin Resistant Staph aureus    MRSA culture [[id number]] Collected: 05/10/21 1426    Order Status: Completed Specimen: Culturette from Throat Updated: 05/11/21 1554     Culture MRSA Surveillance Negative for Methicillin Resistant Staph aureus            Rads:   CT Chest With Contrast    Result Date: 05/13/2021   1. Decreased pneumomediastinum 2. Improved right lower lobe pulmonary infiltrate J. Carole Binning, MD  05/13/2021 10:00 PM     Signed by: Babs Bertin, MD, MD

## 2021-05-14 NOTE — UM Notes (Addendum)
PATIENT NAME: Charles Peterson,Charles Peterson   DOB: Feb 02, 1951     Continued Stay Review  Identified [place] barriers:   PT/OT Discharge Recommendation: Home with no needs  _____________________  CSR Date: 05/14/21, Unit: [facility]    Subjective: Pt is a 70 yo male with a Hx of throat cancer  (8 years ago) s/p neck resection, radiation, and stoma creation, PEG tube placement.  The patient is completely n.p.o. at baseline and uses the PEG tube for all intake of nutrition and medication.  The overall synthesis that he likely had a small esophageal tear. HH pending     Visit Vitals  BP 109/69   Pulse 61   Temp 97.9 F (36.6 C) (Oral)   Resp 18   Ht 1.727 m (5\' 8" )   Wt 57.6 kg (126 lb 14.4 oz)   SpO2 100%   BMI 19.30 kg/m        Labs: Hgb 11.2, Hct 34.8   No fevers, no leukocytosis.  Blood cx 10/22 NGTD.    Diagnostics: CT Chest   1. Decreased pneumomediastinum  2. Improved right lower lobe pulmonary infiltrate     J. Carole Binning, MD   05/13/2021 10:00 PM    Assessment/Plan:  can complete 10 days of empiric antiinfectives with Augmentin 875/125 mg bid and fluconazole 400 mg daily via G tube Patient is not at risk for MDROs currently       planning   Charles Peterson sees Courtney Paris MD G[facility] and has appointment on November 4th. He also works with Rosaria Ferries SLP at [facility] and has appointment the same day. I have updated team. I will continue discharge planning coordination for this afternoon.     Current Facility-Administered Medications   Medication Dose Route Frequency    fluconazole  400 mg Intravenous Q24H    levothyroxine  75 mcg Oral Daily at 0600    pantoprazole  40 mg Intravenous BID    piperacillin-tazobactam  4.5 g Intravenous Q8H       Primary Payor: MEDICARE [employer] / Plan: [place] [employer] / Product Type: MANAGED MEDICARE /       Utilization Review Contact:   [facility]  [address]  [address]  [place],  [place] [address]  Confidential VM only: [phone]  UM Office:  [phone] for urgent and/or after hour needs  Office Hours: M-F 8 am-4:30 pm EST  F: [phone]  Please fax all approvals/denials.  Email: [email or web address]      NOTES TO REVIEWER:    This clinical review is based on/compiled from documentation provided by the treatment team within the patients medical record.

## 2021-05-14 NOTE — Progress Notes (Signed)
[facility]  [facility]        Patient Name: Charles Peterson, Charles Peterson     MRN: [id number]      CSN: [id number]         Account Information     Hosp Acct #   [id number] Patient Class   Inpatient Service  Medicine Accommodation Code  Semi-Private      Admission Information     Admitting Physician:  Attending Physician: Sharyn Lull, MD  Tamela Oddi, MD Unit  [facility]* L&D Status      Admitting Diagnosis: Esophageal Rupture; Pneumomediastinum Room / Bed  F919/F919.01 L&D - Last Menstrual Cycle      Chief Complaint:       Admit Type:  Admit Date/Time:  Discharge Date/Time: Information Not Available  05/10/2021 / 1242   /  Length of Stay: 4 Days    L&D EDD   Estimated Date of Delivery: None noted.      Patient Information              Home Address: [address]  [place] [address] Employer:  Employer Address:       ,     Main Phone: [phone] Employer Phone:     SSN: [id number]       DOB: [birth year] (70 yrs)       Sex: Male Primary Care Physician: Janice Coffin, MD   Marital Status: Unknown Referring Physician:       No ref. provider found   Race: Unavailable       Ethnicity: Unavailable       Emergency Contacts  Name Home Phone Work Phone Mobile Phone Relationship Philippa Chester     [phone] Daughter No         Guarantor Information     Guarantor Name: COLLIER, BOHNET ID: [id number]   Guarantor Relationship to Pt: Self Guarantor Type: Personal/Family   Guarantor DOB:    [birth year]       Guarantor Address: [address]   [place], [place] [address]          Guarantor Home Phone: [phone] Guarantor Employer:        Guarantor Work Phone:   Pensions consultant Emp Phone:                     Primary Insurance     Insurance Name: MEDICARE [employer]* Subscriber Name: [employer] Address:    [address]  [place], [place] [address] Subscriber DOB: December 28, 1950      Subscriber ID: [id number]   Insurance Phone: [phone] Pt  Relationship to Sub:   Self   Insurance ID:         Group Name:   Preauthorization #:     Group #:   Preauthorization Days:        Secondary Social worker Name: - Statistician Name:     Community education officer Address:       ,   Statistician DOB:        Subscriber ID:     Press photographer:   Pt Relationship to Sub:       Insurance ID:         Group Name:   Preauthorization #:     Group #:   Preauthorization Days:        [employer] Name: - Subscriber Name:     Nurse, children's:       ,  Subscriber DOB:        Subscriber ID:     Insurance Phone:   Pt Relationship to Sub:       Insurance ID:         Group Name:   Preauthorization #:     Group #:   Preauthorization Days:           05/14/2021 4:46 PM           Home Health face-to-face (FTF) Encounter (Order [id number])  Consult  Date: 05/14/2021 Department: [facility] [place] [facility] [place] Ordering/Authorizing: Tamela Oddi, MD     Order Information    Order Date/Time Release Date/Time Start Date/Time End Date/Time   05/14/21 03:46 PM None 05/14/21 03:42 PM 05/14/21 03:42 PM     Order Details    Frequency Duration Priority Order Class   Once 1  occurrence Routine Hospital Performed     Standing Order Information    Remaining Occurrences Interval Last Released     0/1 Once 05/14/2021              Provider Information    Ordering User Ordering Provider Authorizing Provider   Beckey Downing, RN Borgschulte, Edger House, MD Tamela Oddi, MD   Attending Provider(s) Admitting Provider PCP   Lianne Moris, MD; Velia Meyer, MD; Tamela Oddi, MD; Andi Hence, DO Sharyn Lull, MD Janice Coffin, MD     Verbal Order Info    Action Created on Order Mode Entered by Responsible Provider Signed by Signed on   Ordering 05/14/21 1546 Telephone with Shea Evans, RN Tamela Oddi, MD Tamela Oddi, MD 05/14/21 1552       Comments    Suctioning machine, canister and Yankaur  needed >99 months, NPI: _[id number]   Dx:  Pneumomediastinum J98.2                Home Health face-to-face (FTF) Encounter: Patient Communication     Not Released  Not seen         Order Questions    Question Answer Comment   Date I saw the patient face-to-face: 05/14/2021    Evidence this patient is homebound because: O.  N/A DME only    Medical conditions that necessitate Home Health care: M.  N/A DME only. No skilled services needed    Per clinical findings, following services are medically necessary: DME    DME Other (add in comment) Suctioning machine with canister and Yankaur   Other (please specify) see comments        Process Instructions    Please select Home Care Services medically necessary.     Based on the above findings, I certify that this patient is confined to the home and needs intermittent skilled nursing care, physical therapry and / or speech therapy or continues to need occupational therapy. The patient is under my care, and I have initiated the establishment of the plan of care. This patient will be followed by a physician who will periodically review the plan of care.      Collection Information        Consult Order Info    ID Description Priority Start Date Start Time   [id number] Home Health face-to-face (FTF) Encounter Routine 05/14/2021  3:42 PM   Provider Specialty Referred to   ______________________________________ _____________________________________       Acknowledgement Info    For At Acknowledged By Acknowledged On   Placing Order 05/14/21 1546 Adam Phenix, RN 05/14/21 [phone]  Verbal Order Info    Action Created on Order Mode Entered by Responsible Provider Signed by Signed on   Ordering 05/14/21 1546 Telephone with Shea Evans, RN Tamela Oddi, MD Tamela Oddi, MD 05/14/21 1552       Patient Information    Patient Name   Charles Peterson Legal Sex   Male DOB   Jul 11, 1951       Reprint Order Requisition    Home Health face-to-face (FTF) Encounter (Order [id number]) on 05/14/21       Additional  Information    Associated Reports External References   Priority and Order Details [facility]       Marquita Palms, NP    Nurse Practitioner   Critical Care Medicine   H&P       Signed   Date of Service:  05/10/2021  2:06 PM   Creation Time:  05/10/2021  2:06 PM              Signed          [facility])       ADMISSION- HISTORY AND PHYSICAL EXAM        Date Time: 05/10/21 2:06 PM  Patient Name: Charles Peterson  Attending Physician: Lianne Moris, MD  Primary Care Physician: No primary care provider on file.  Location/Room: W098/J191.47      Chief Complaint / Primary reason for ICU evaluation:  Pneumomediastinum       History of Presenting Illness:   Charles Peterson is a 70 y.o. 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  He had esophageal balloon dilation twice with the last time in Sept 2022.  He presented to [facility] ER on 05/09/21 after coughing and noting blood from his PEG tube along with mid sternal tightness.  He denied shortness of breath, fatigue, other GI, GU, or neurological complaints.   CT scan of the chest did show pneumomediastinum in the lower chest concerning for esophageal perforation.  Transfer to [facility] was requested for thoracic surgery evaluation.       Problem List:   Problem List:       Patient Active Problem List   Diagnosis    Pneumomediastinum      Pneumomediastinum  HTN  HLD  Hypothyroidism  GERD  H/o Throat cancer  S/p Stoma creation  S/p PEG tube  S/p esophageal dilation      Plan:   Dispo:     Critical Care services by organ system  NEURO:   NAI     CV:   # HTN  - hold home antihypertensives for now  - Home agents include:  Norvasc 10 mg qd, Hydralazine 50 mg bid, atorvastatin 20 mg qHS  - check 12 lead  - TNI negative at OSH  - BNP 170 at OSH  Blood pressure goal: MAP >65, SBP <160     PULM:   # h/o  throat cancer with stoma  - stable on RA     GI:   # Possible esophageal perforation with pneumomediastinum  # bleeding from PEG  # h/o throat cancer  # s/p stoma and PEG  - Thoracic surgery consulted  - ABX as below  - Protonix bid  - NPO given ? Esophageal perforation      RENAL:   NAI  I/O Goal: AR  ID:   # possible esophageal perforation  - Check blood cultures  - Zosyn  - Metronidazole  - Fluconazole     HEME:   Check CBC, coags, send type/screen  SCD's  Pharmacologic ppx: not safe secondary to bleeding from PEG     ENDO/RHEUM:   # Hypothyroidism  - Synthroid 75 mcg qam (will order IV 37.5 mcg given possible esophageal perf)  Glucose: goal 100 -180     Code Status: Full Code  Patient is currently able to make medical decisions.       Patients designated proxy is daughter [name] Plan [phone]) and was not present.     Discussed current diagnoses, prognosis and goals of care.      Past Medical History:   Past Medical History   No past medical history on file.        Past Surgical History:   Past Surgical History   No past surgical history on file.        Family History:   Family History   No family history on file.        Social History:      Social History - Main Topic   Social History            Socioeconomic History    Marital status: Unknown       Spouse name: Not on file    Number of children: Not on file    Years of education: Not on file    Highest education level: Not on file   Occupational History    Not on file   Tobacco Use    Smoking status: Not on file    Smokeless tobacco: Not on file   Substance and Sexual Activity    Alcohol use: Not on file    Drug use: Not on file    Sexual activity: Not on file   Other Topics Concern    Not on file   Social History Narrative    Not on file      Social Determinants of Health      Financial Resource Strain: Not on file   Food Insecurity: Not on file   Transportation Needs: Not on file   Physical Activity: Not on file   Stress: Not on file   Social  Connections: Not on file   Intimate Partner Violence: Not on file   Housing Stability: Not on file            Allergies:   No Known Allergies     Medications:   Hospital medications:            Current Facility-Administered Medications   Medication Dose Route Frequency    pantoprazole  40 mg Intravenous BID    senna-docusate  1 tablet Oral Q12H SCH         IV Fluids and Medications   Current Facility-Administered Medications   Medication            Review of Systems:   As noted in HPI     Physical Exam:   Heart Rate:  [79] 79  Resp Rate:  [19] 19  BP: (122)/(75) 122/75     Intake and Output Summary (Last 24 hours) at Date Time  No intake or output data in the 24 hours ending 05/10/21 1406     Vent Settings:     BP 122/75    Pulse 79    Resp 19    SpO2 98%  General Appearance:  alert, well appearing, and in no distress  Mental status:  alert, oriented to person, place, and time  Neuro:  alert, oriented, normal speech, no focal findings or movement disorder noted  Lungs: clear to auscultation, no wheezes, rales or rhonchi, symmetric air entry.  Stoma present  Cardiac: normal rate, regular rhythm, normal S1, S2, no murmurs, rubs, clicks or gallops  Abdomen:  soft, nontender, nondistended, no masses or organomegaly.  PEG present, clean site, dark brown liquid noted in tube.  Extremities: peripheral pulses normal, no pedal edema, no clubbing or cyanosis   Skin:   normal coloration and turgor, no rashes, no suspicious skin lesions noted  Other:       Labs:      Labs in the last 24 hours   Results         ** No results found for the last 24 hours. **                   Microbiology Results (last 15 days)         Procedure Component Value Units Date/Time     Culture Blood Aerobic and Anaerobic [[id number]]       Order Status: Sent Specimen: Blood, Venipuncture       Culture Blood Aerobic and Anaerobic [[id number]]       Order Status: Sent Specimen: Blood, Venipuncture       MRSA culture [[id number]]       Order Status:  Sent Specimen: Culturette from Nares       MRSA culture [[id number]]       Order Status: Sent Specimen: Culturette from Throat                  Radiology / Imaging:      No orders to display         I have personally reviewed the patient's history and 24 hour interval events, along with vitals, labs, radiology images, and additional findings found in detail within ICU team notes from house staff, NPPs and nursing, with their care plans developed and reviewed by me. So far today, I have spent 45 minutes providing critical care for this patient excluding teaching and billable procedures, not overlapping with over providers.      Signed by: Marquita Palms, NP  05/10/2021 2:06 PM  Cc:No primary care provider on file.  For admissions 7a-7p: [phone]  [facility] attending: [phone]  [facility] attending: [phone]     This note was generated by the Epic EMR system/ Dragon speech recognition and may contain inherent errors or omissions not intended by the user. Grammatical errors, random word insertions, deletions, pronoun errors and incomplete sentences are occasional consequences of this technology due to software limitations. Not all errors are caught or corrected. If there are questions or concerns about the content of this note or information contained within the body of this dictation they should be addressed directly with the author for clarification                  Cosigned by: Sharyn Lull, MD at 05/10/2021  3:45 PM

## 2021-05-14 NOTE — Discharge Summary -  Nursing (Signed)
Discharge instructions discussed with pt and daughter at bedside. Newly prescribed meds discussed; questions answered. Pt provides self care w/ PEG tube. Confirmed w/ pt how to properly administer meds via PEG tube through teach back method. R FA PIV removed prior to discharge. Pt wheeled to lobby and discharged home w/ daughter at 4.

## 2021-05-18 LAB — ECG 12-LEAD
Atrial Rate: 79 {beats}/min
IHS MUSE NARRATIVE AND IMPRESSION: NORMAL
P Axis: 73 degrees
P-R Interval: 136 ms
Q-T Interval: 390 ms
QRS Duration: 76 ms
QTC Calculation (Bezet): 447 ms
R Axis: 42 degrees
T Axis: 67 degrees
Ventricular Rate: 79 {beats}/min

## 2021-05-20 NOTE — Progress Notes (Signed)
[facility]   (P): [phone]   (F): [phone]    Note:  Per rep renzo, suction machine, canister and yanker order was delivered on 05/15/2021
